# Patient Record
Sex: Female | Born: 1935 | Race: White | Hispanic: No | Marital: Married | Smoking: Never smoker
Health system: Southern US, Community
[De-identification: ages and names within clinical notes are randomized; demographics above are authoritative.]

## PROBLEM LIST (undated history)

## (undated) DIAGNOSIS — F419 Anxiety disorder, unspecified: Secondary | ICD-10-CM

## (undated) DIAGNOSIS — I714 Abdominal aortic aneurysm, without rupture, unspecified: Secondary | ICD-10-CM

## (undated) DIAGNOSIS — E7800 Pure hypercholesterolemia, unspecified: Secondary | ICD-10-CM

## (undated) DIAGNOSIS — I10 Essential (primary) hypertension: Secondary | ICD-10-CM

## (undated) DIAGNOSIS — K219 Gastro-esophageal reflux disease without esophagitis: Secondary | ICD-10-CM

## (undated) DIAGNOSIS — M810 Age-related osteoporosis without current pathological fracture: Secondary | ICD-10-CM

---

## 2003-11-11 ENCOUNTER — Ambulatory Visit: Payer: Self-pay | Admitting: Unknown Physician Specialty

## 2004-04-17 ENCOUNTER — Ambulatory Visit: Payer: Self-pay | Admitting: Unknown Physician Specialty

## 2004-12-07 ENCOUNTER — Ambulatory Visit: Payer: Self-pay | Admitting: Unknown Physician Specialty

## 2004-12-12 ENCOUNTER — Ambulatory Visit: Payer: Self-pay | Admitting: Unknown Physician Specialty

## 2005-12-17 ENCOUNTER — Ambulatory Visit: Payer: Self-pay | Admitting: Unknown Physician Specialty

## 2006-10-29 ENCOUNTER — Ambulatory Visit: Payer: Self-pay | Admitting: Unknown Physician Specialty

## 2006-12-24 ENCOUNTER — Ambulatory Visit: Payer: Self-pay | Admitting: Unknown Physician Specialty

## 2008-04-15 ENCOUNTER — Ambulatory Visit: Payer: Self-pay | Admitting: Unknown Physician Specialty

## 2009-04-19 ENCOUNTER — Ambulatory Visit: Payer: Self-pay | Admitting: Unknown Physician Specialty

## 2010-04-24 ENCOUNTER — Ambulatory Visit: Payer: Self-pay | Admitting: Unknown Physician Specialty

## 2010-04-25 ENCOUNTER — Ambulatory Visit: Payer: Self-pay | Admitting: Internal Medicine

## 2011-03-20 ENCOUNTER — Ambulatory Visit: Payer: Self-pay | Admitting: Ophthalmology

## 2011-03-27 ENCOUNTER — Ambulatory Visit: Payer: Self-pay | Admitting: Ophthalmology

## 2011-05-07 ENCOUNTER — Ambulatory Visit: Payer: Self-pay | Admitting: Internal Medicine

## 2011-05-21 ENCOUNTER — Ambulatory Visit: Payer: Self-pay | Admitting: Ophthalmology

## 2014-05-23 NOTE — Op Note (Signed)
PATIENT NAME:  Sheri Porter, Sheri Porter MR#:  [id number] DATE OF BIRTH:  1926/05/06  DATE OF PROCEDURE:  05/21/2011  PREOPERATIVE DIAGNOSIS:  Cataract, right eye.   POSTOPERATIVE DIAGNOSIS:  Cataract, right eye.  PROCEDURE PERFORMED:  Extracapsular cataract extraction using phacoemulsification with placement of an Alcon SN6CWS, 22.0-diopter posterior chamber lens, serial [id number].  SURGEON:  Maylon PeppersSteven A. Tationna Fullard, MD  ASSISTANT:  None.  ANESTHESIA:  4% lidocaine and 0.75% Marcaine in a 50/50 mixture with 10 units/mL of Hylenex added, given as a peribulbar.   ANESTHESIOLOGIST:  Dr. Darleene CleaverVan Staveren.   COMPLICATIONS:  None.  ESTIMATED BLOOD LOSS:  Less than 1 ml.  DESCRIPTION OF PROCEDURE:  The patient was brought to the operating room and given a peribulbar block.  The patient was then prepped and draped in the usual fashion.  The vertical rectus muscles were imbricated using 5-0 silk sutures.  These sutures were then clamped to the sterile drapes as bridle sutures.  A limbal peritomy was performed extending two clock hours and hemostasis was obtained with cautery.  A partial thickness scleral groove was made at the surgical limbus and dissected anteriorly in a lamellar dissection using an Alcon crescent knife.  The anterior chamber was entered superonasally with a Superblade and through the lamellar dissection with a 2.6 mm keratome.  DisCoVisc was used to replace the aqueous and a continuous tear capsulorrhexis was carried out.  Hydrodissection and hydrodelineation were carried out with balanced salt and a 27 gauge canula.  The nucleus was rotated to confirm the effectiveness of the hydrodissection.  Phacoemulsification was carried out using a divide-and-conquer technique.  Total ultrasound time was three minutes and ten seconds with an average power of 21.6 percent. CDE 61.56.   Irrigation/aspiration was used to remove the residual cortex.  DisCoVisc was used to inflate the capsule and the internal  incision was enlarged to 3 mm with the crescent knife.  The intraocular lens was folded and inserted into the capsular bag using the AcrySert delivery system. Irrigation/aspiration was used to remove the residual DisCoVisc.  Miostat was injected into the anterior chamber through the paracentesis track to inflate the anterior chamber and induce miosis.  The wound was checked for leaks and none were found. The conjunctiva was closed with cautery and the bridle sutures were removed.  Two drops of 0.3% Vigamox were placed on the eye.   An eye shield was placed on the eye.  The patient was discharged to the recovery room in good condition.   ____________________________ Maylon PeppersSteven A. Shiori Adcox, MD sad:ap D: 05/21/2011 13:07:11 ET T: 05/21/2011 13:28:23 ET JOB#: [id number]  cc: Viviann SpareSteven A. Blandina Renaldo, MD, <Dictator> Erline LevineSTEVEN A Illyria Sobocinski MD ELECTRONICALLY SIGNED 05/28/2011 13:45

## 2014-05-23 NOTE — Op Note (Signed)
PATIENT NAME:  Sheri Porter, Sheri Porter MR#:  [id number] DATE OF BIRTH:  [birth year]  DATE OF PROCEDURE:  03/27/2011  PREOPERATIVE DIAGNOSIS:  Cataract, left eye.    POSTOPERATIVE DIAGNOSIS:  Cataract, left eye.  PROCEDURE PERFORMED:  Extracapsular cataract extraction using phacoemulsification with placement of an Alcon SN6CWS, 23.0-diopter posterior chamber lens, serial # [id number].   SURGEON:  Maylon PeppersSteven A. Lunah Losasso, MD  ASSISTANT:  None.  ANESTHESIA:  4% lidocaine and 0.75% Marcaine in a 50/50 mixture with 10 units per mL of Hylenex added given as peribulbar.  ANESTHESIOLOGIST:  Dr. Pernell DupreAdams   COMPLICATIONS:  None.  ESTIMATED BLOOD LOSS:  Less than 1 mL.  DESCRIPTION OF PROCEDURE:  The patient was brought to the operating room and given a peribulbar block.  The patient was then prepped and draped in the usual fashion.  The vertical rectus muscles were imbricated using 5-0 silk sutures.  These sutures were then clamped to the sterile drapes as bridle sutures.  A limbal peritomy was performed extending two clock hours and hemostasis was obtained with cautery.  A partial thickness scleral groove was made at the surgical limbus and dissected anteriorly in a lamellar dissection using an Alcon crescent knife.  The anterior chamber was entered supero-temporally with a Superblade and through the lamellar dissection with a 2.6 mm keratome.  DisCoVisc was used to replace the aqueous and a continuous tear capsulorrhexis was carried out.  Hydrodissection and hydrodelineation were carried out with balanced salt and a 27 gauge canula.  The nucleus was rotated to confirm the effectiveness of the hydrodissection.  Phacoemulsification was carried out using a divide-and-conquer technique.  Total ultrasound time was 1 minute and 39.1 seconds with an average power of 24.7 percent, CDE 36.68.  Irrigation/aspiration was used to remove the residual cortex.  DisCoVisc was used to inflate the capsule and the internal incision  was enlarged to 3 mm with the crescent knife.  The intraocular lens was folded and inserted into the capsular bag using the AcrySert delivery system.  Irrigation/aspiration was used to remove the residual DisCoVisc.  Miostat was injected into the anterior chamber through the paracentesis track to inflate the anterior chamber and induce miosis.  The wound was checked for leaks and none were found. The conjunctiva was closed with cautery and the bridle sutures were removed.  Two drops of 0.3% Vigamox were placed on the eye.   An eye shield was placed on the eye.  The patient was discharged to the recovery room in good condition.  ____________________________ Maylon PeppersSteven A. Mozel Burdett, MD sad:drc D: 03/27/2011 15:09:19 ET T: 03/27/2011 15:39:06 ET JOB#: [id number]  cc: Viviann SpareSteven A. Raye Slyter, MD, <Dictator> Erline LevineSTEVEN A Shep Porter MD ELECTRONICALLY SIGNED 04/01/2011 15:59

## 2016-04-17 ENCOUNTER — Ambulatory Visit: Admission: RE | Admit: 2016-04-17 | Payer: Self-pay | Source: Ambulatory Visit

## 2016-04-17 ENCOUNTER — Other Ambulatory Visit: Payer: Self-pay | Admitting: Orthopedic Surgery

## 2016-04-17 DIAGNOSIS — M25551 Pain in right hip: Secondary | ICD-10-CM

## 2016-04-18 ENCOUNTER — Ambulatory Visit
Admission: RE | Admit: 2016-04-18 | Discharge: 2016-04-18 | Disposition: A | Discharge status: Home / Self Care | Payer: Medicare Other | Source: Ambulatory Visit | Attending: Orthopedic Surgery | Admitting: Orthopedic Surgery

## 2016-04-18 DIAGNOSIS — K5790 Diverticulosis of intestine, part unspecified, without perforation or abscess without bleeding: Secondary | ICD-10-CM | POA: Insufficient documentation

## 2016-04-18 DIAGNOSIS — M8448XA Pathological fracture, other site, initial encounter for fracture: Secondary | ICD-10-CM | POA: Insufficient documentation

## 2016-04-18 DIAGNOSIS — M25551 Pain in right hip: Secondary | ICD-10-CM | POA: Diagnosis present

## 2016-04-20 ENCOUNTER — Emergency Department
Admission: EM | Admit: 2016-04-20 | Discharge: 2016-04-20 | Disposition: A | Discharge status: Home / Self Care | Payer: Medicare Other | Attending: Emergency Medicine | Admitting: Emergency Medicine

## 2016-04-20 ENCOUNTER — Encounter: Payer: Self-pay | Admitting: Emergency Medicine

## 2016-04-20 ENCOUNTER — Encounter
Admission: RE | Admit: 2016-04-20 | Discharge: 2016-04-20 | Disposition: A | Discharge status: Home / Self Care | Payer: Medicare Other | Source: Ambulatory Visit | Attending: Internal Medicine | Admitting: Internal Medicine

## 2016-04-20 DIAGNOSIS — Y999 Unspecified external cause status: Secondary | ICD-10-CM | POA: Diagnosis not present

## 2016-04-20 DIAGNOSIS — Y929 Unspecified place or not applicable: Secondary | ICD-10-CM | POA: Diagnosis not present

## 2016-04-20 DIAGNOSIS — S329XXD Fracture of unspecified parts of lumbosacral spine and pelvis, subsequent encounter for fracture with routine healing: Secondary | ICD-10-CM | POA: Insufficient documentation

## 2016-04-20 DIAGNOSIS — S3993XD Unspecified injury of pelvis, subsequent encounter: Secondary | ICD-10-CM | POA: Diagnosis present

## 2016-04-20 DIAGNOSIS — X58XXXA Exposure to other specified factors, initial encounter: Secondary | ICD-10-CM | POA: Diagnosis not present

## 2016-04-20 DIAGNOSIS — Y939 Activity, unspecified: Secondary | ICD-10-CM | POA: Diagnosis not present

## 2016-04-20 DIAGNOSIS — I10 Essential (primary) hypertension: Secondary | ICD-10-CM | POA: Insufficient documentation

## 2016-04-20 HISTORY — DX: Anxiety disorder, unspecified: F41.9

## 2016-04-20 HISTORY — DX: Abdominal aortic aneurysm, without rupture, unspecified: I71.40

## 2016-04-20 HISTORY — DX: Age-related osteoporosis without current pathological fracture: M81.0

## 2016-04-20 HISTORY — DX: Pure hypercholesterolemia, unspecified: E78.00

## 2016-04-20 HISTORY — DX: Gastro-esophageal reflux disease without esophagitis: K21.9

## 2016-04-20 HISTORY — DX: Abdominal aortic aneurysm, without rupture: I71.4

## 2016-04-20 HISTORY — DX: Essential (primary) hypertension: I10

## 2016-04-20 NOTE — ED Notes (Signed)
Social Work at bedside 

## 2016-04-20 NOTE — ED Notes (Signed)
Patient home with family.

## 2016-04-20 NOTE — [place] FL2 (Signed)
  [place] MEDICAID FL2 LEVEL OF CARE SCREENING TOOL     IDENTIFICATION  Patient Name: Sheri Porter Birthdate: 07/16/1926 Sex: female Admission Date (Current Location): 04/20/2016  [place]   Facility and Address:  [facility], [address], [place], [place] [address]      Provider Number: [id number]  Attending Physician Name and Address:  Jeanmarie PlantJames A McShane, MD  Relative Name and Phone Number:       Current Level of Care: Hospital Recommended Level of Care: Skilled Nursing Facility Prior Approval Number:    Date Approved/Denied:   PASRR Number: [id number]  Discharge Plan: SNF    Current Diagnoses: There are no active problems to display for this patient.   Orientation RESPIRATION BLADDER Height & Weight     Self, Time, Situation, Place  Normal Continent Weight: 102 lb (46.3 kg) Height:  5' (152.4 cm)  BEHAVIORAL SYMPTOMS/MOOD NEUROLOGICAL BOWEL NUTRITION STATUS      Continent Diet (Normal)  AMBULATORY STATUS COMMUNICATION OF NEEDS Skin   Limited Assist Verbally Normal                       Personal Care Assistance Level of Assistance  Bathing, Feeding, Dressing, Total care Bathing Assistance: Independent Feeding assistance: Limited assistance Dressing Assistance: Limited assistance Total Care Assistance: Limited assistance   Functional Limitations Info  Sight, Hearing, Speech Sight Info: Adequate Hearing Info: Impaired (Hear aid left ear) Speech Info: Adequate    SPECIAL CARE FACTORS FREQUENCY                       Contractures Contractures Info: Not present    Additional Factors Info  Code Status, Allergies Code Status Info: Full code Allergies Info: Prepulsid, septra, Trazadone nefazodone           Current Medications (04/20/2016):  This is the current hospital active medication list No current facility-administered medications for this encounter.    No current outpatient  prescriptions on file.     Discharge Medications: Please see discharge summary for a list of discharge medications.  Relevant Imaging Results:  Relevant Lab Results:   Additional Information    Johnella MoloneyBandi, [place]rack[place]laud[place] M, [place]

## 2016-04-20 NOTE — ED Provider Notes (Addendum)
[facility] Emergency Department Provider Note  ____________________________________________   I have reviewed the triage vital signs and the nursing notes.   HISTORY  Chief Complaint Pelvic Fracture Pain    HPI Sheri Porter is a 83 y.o. female with a known inoperable pelvic fracture diagnosed on Wednesday. Patient has been at home with family. She is beginning 25 mg of tramadol twice a day. They feel this is insufficient pain control. When I ask if they would like the patient to have better coverage physician the room to go up on that, they state no they would like her to be admitted to the hospital and then admitted to a nursing home. This is the refill cause of the visit. Patient does not have a recent fall. She does live by herself at home.       Past Medical History:  Diagnosis Date  . Abdominal aortic aneurysm without rupture (HCC)   . Anxiety   . GERD (gastroesophageal reflux disease)   . Hypercholesteremia   . Hypertension   . Osteoporosis     There are no active problems to display for this patient.   History reviewed. No pertinent surgical history.  Prior to Admission medications   Not on File    Allergies Propulsid [cisapride]; Septra [sulfamethoxazole-trimethoprim]; and Trazodone and nefazodone  History reviewed. No pertinent family history.  Social History Social History  Substance Use Topics  . Smoking status: Never Smoker  . Smokeless tobacco: Never Used  . Alcohol use No    Review of Systems Constitutional: No fever/chills Eyes: No visual changes. ENT: No sore throat. No stiff neck no neck pain Cardiovascular: Denies chest pain. Respiratory: Denies shortness of breath. Gastrointestinal:   no vomiting.  No diarrhea.  No constipation. Genitourinary: Negative for dysuria. Musculoskeletal: Negative lower extremity swelling Skin: Negative for rash. Neurological: Negative for severe headaches, focal weakness or  numbness. 10-point ROS otherwise negative.  ____________________________________________   PHYSICAL EXAM:  VITAL SIGNS: ED Triage Vitals  Enc Vitals Group     BP 04/20/16 1058 127/88     Pulse Rate 04/20/16 1058 81     Resp 04/20/16 1058 18     Temp 04/20/16 1058 97.5 F (36.4 C)     Temp Source 04/20/16 1058 Oral     SpO2 04/20/16 1053 95 %     Weight 04/20/16 1059 102 lb (46.3 kg)     Height 04/20/16 1059 5' (1.524 m)     Head Circumference --      Peak Flow --      Pain Score --      Pain Loc --      Pain Edu? --      Excl. in GC? --     Constitutional: Alert and orientedTo name and place unsure of the date. Well appearing and in no acute distress. Eyes: Conjunctivae are normal. PERRL. EOMI. Head: Atraumatic. Nose: No congestion/rhinnorhea. Mouth/Throat: Mucous membranes are moist.  Oropharynx non-erythematous. Neck: No stridor.   Nontender with no meningismus Cardiovascular: Normal rate, regular rhythm. Grossly normal heart sounds.  Good peripheral circulation. Respiratory: Normal respiratory effort.  No retractions. Lungs CTAB. Abdominal: Soft and nontender. No distention. No guarding no rebound Back:  There is no focal tenderness or step off.  there is no midline tenderness there are no lesions noted. there is no CVA tenderness Musculoskeletal: No lower extremity tenderness, no upper extremity tenderness. No joint effusions, no DVT signs strong distal pulses no edema Neurologic:  Normal  speech and language. No gross focal neurologic deficits are appreciated.  Skin:  Skin is warm, dry and intact. No rash noted. Psychiatric: Mood and affect are normal. Speech and behavior are normal.  ____________________________________________   LABS (all labs ordered are listed, but only abnormal results are displayed)  Labs Reviewed - No data to display ____________________________________________  EKG  I personally interpreted any EKGs ordered by me or  triage  ____________________________________________  RADIOLOGY  I reviewed any imaging ordered by me or triage that were performed during my shift and, if possible, patient and/or family made aware of any abnormal findings. ____________________________________________   PROCEDURES  Procedure(s) performed: None  Procedures  Critical Care performed: None  ____________________________________________   INITIAL IMPRESSION / ASSESSMENT AND PLAN / ED COURSE  Pertinent labs & imaging results that were available during my care of the patient were reviewed by me and considered in my medical decision making (see chart for details).  Patient here essentially for pain control and placement for a pelvic fracture that happened several days ago. We will have case manager talked to the family about their options in this regard. She has no ongoing pain at this moment not requesting pain medication vital signs are stable  ----------------------------------------- 11:50 AM on 04/20/2016 -----------------------------------------  A work, case management of both discussed with this patient. Unfortunately by Medicare rules she does not qualify for Medicare funded skilled nursing facility placement. However, there is always the option for private pay if family can afford it. I also discussed with Dr. Dareen Piano, who tells me that he has been telling the family this all week and that there was limited utility in an emergency room visit because the Medicare rules will not change based upon a presentation to the emergency room. They have already according to Dr. Dareen Piano started to set up home health care and already filled out and FL2 . I have asked social work to see if they can be given all the legal options we have. Even if we do get the patient admitted, it would not likely be for anything more than an observational stay which would not meet Medicare requirements for acute placement. This it seems to me  should be therefore managed at home or with private headache given the limitations of the insurance that the patient has unfortunately. This is obviously not something that I are anyone here has any control over we have tried explaining this to the family. According to primary care this has been explained to them already.. I can certainly increase her pain medication dosage to see if we can maintain adequate analgesia and we will further discuss with family. This is a lamentablel situation in these respects.   ----------------------------------------- 1:14 PM on 04/20/2016 -----------------------------------------  Patient and family would like to go home at this time. They do not wish any further care from me. He did offer to physical therapy evaluate him but they do not wish do that here. I've advised to go up on her tramadol to a full pill twice a day, they will follow up close with primary care doctor. Patient is in no acute distress. Case management assures me she does not meet criteria for admission and I agree. I have explained extensively the limitations of what we can do for them unfortunately given their insurance situation and they will continue to pursue outpatient care at skilled nursing facilities possibly to her private pay. They will call her doctor today and continue to work on this. No further  issues in the family is eager to go home. They decline offered pain medication for this patient as she is para comfortable with no discomfort and they understand they must come back if they feel worse. Penni Bombard, nurse, was present for this conversation ----------------------------------------- 2:15 PM on 04/20/2016 -----------------------------------------  I filled out fl2 form and we have found placement for her on Sunday, today is Friday. Family happy with this plan.    ____________________________________________   FINAL CLINICAL IMPRESSION(S) / ED DIAGNOSES  Final diagnoses:  None       This chart was dictated using voice recognition software.  Despite best efforts to proofread,  errors can occur which can change meaning.      Jeanmarie Plant, MD 04/20/16 1126    Jeanmarie Plant, MD 04/20/16 1155    Jeanmarie Plant, MD 04/20/16 1316    Jeanmarie Plant, MD 04/20/16 1319    Jeanmarie Plant, MD 04/20/16 1416

## 2016-04-20 NOTE — Discharge Instructions (Signed)
Return to the emergency room for any new or worrisome symptoms. I encourage you to continue pursuing options for outpatient placement as you have discussed with us and follow closely with Dr. Dareen PianoAnderson. I would take a full tramadol instead of a half a tramadol for her I think this will help pain management, he can do that at the same scheduled dosing.

## 2016-04-20 NOTE — Progress Notes (Signed)
LCSW and Care manager went in to discuss the current situation with family to assess their immediate needs. Care Manager explained medicare benefits and limitations and stated that in home personal supports would be out of pocket with the exception of PT 3x week. LCSW explained that a facility could be found for the patient  but it would be a private pay. LCSW did explain at length that the information would be compiled and bed offers would be made and the family would be able to select. (Private pay at most facilities required 30 day up front fee) approx  300.00 per day. LCSW offered to be available once they decide how they would like to proceed.  Will await for Care managers plan and family requested EDP to consult again. Suezette Lafave LCSW

## 2016-04-20 NOTE — Clinical Social Work Note (Signed)
Clinical Social Work Assessment  Patient Details  Name: Sheri Porter MRN: [id number] Date of Birth: 04/17/1926  Date of referral:  04/13/16               Reason for consult:  Facility Placement                Permission sought to share information with:  Family Supports Permission granted to share information::  Yes, Verbal Permission Granted  Name::        Agency::     Relationship::  [name]- Daughter [phone]  Contact Information:     Housing/Transportation Living arrangements for the past 2 months:  Single Family Home Source of Information:  Patient, Adult Children Patient Interpreter Needed:  None Criminal Activity/Legal Involvement Pertinent to Current Situation/Hospitalization:  No - Comment as needed Significant Relationships:  Adult Children Lives with:  Self Do you feel safe going back to the place where you live?  No Need for family participation in patient care:  Yes (Comment)  Care giving concerns: Patient needs SNF as she recovers from hip fracture   Social Worker assessment / plan: LCSW and care Manager introduced ourselves to both family members. Verbal permission was granted to speak to Daughter and son in law and patients neighbour. We were given verbal consent to speak to all facilities and patients family agreeable for 30 day private pay. They will arrange out patient PT for their loved one. It was explained to family that all information collected would go out to facilities and  Bed offers would be presented to family. LCSW will complete Fl2 and Passr number.  Employment status:  Retired Health and safety inspectornsurance information:  Medicare PT Recommendations:  Not assessed at this time Information / Referral to community resources:  Skilled Nursing Facility  Patient/Family's Response to care:  They are unable to provide in home supports at this time  Patient/Family's Understanding of and Emotional Response to Diagnosis, Current Treatment, and Prognosis:  They  would like her in a facility ASAP  Emotional Assessment Appearance:  Appears stated age Attitude/Demeanor/Rapport:   (Polite and calm) Affect (typically observed):  Accepting, Adaptable, Appropriate, Calm Orientation:  Oriented to Self, Oriented to Place, Oriented to  Time, Oriented to Situation Alcohol / Substance use:  Not Applicable Psych involvement (Current and /or in the community):  No (Comment)  Discharge Needs  Concerns to be addressed:  Home Safety Concerns Readmission within the last 30 days:  No Current discharge risk:  None Barriers to Discharge:  No Barriers Identified   Cheron SchaumannBandi, [name], LCSW 04/20/2016, 1:34 PM

## 2016-04-20 NOTE — ED Notes (Addendum)
Patient's family is here to look for permanent placement for patient.  Family believes that in home care or home PT would be insufficient at this time.  MD is aware of family desires.  Case Manager to be informed of information so they can speak with family and work on placement.

## 2016-04-20 NOTE — Care Management Note (Signed)
Case Management Note  Patient Details  Name: Sheri Porter MRN: [id number] Date of Birth: 11/11/1926  Subjective/Objective:    Spoke to patient , family and neighbor in room. MD has designated that patient does not meet any admission criteria, either OBSv or IP. Claudine, CSW and I have presented to the family that their options are to pay out of pocket for personal services, or pay out of pocket for placement. They are a bit overwhelmed, but the son in law is visibly angered that he feels the MD is making a poor judgement. We have re-iterated that all hospitals follow the same Medicare quidelines, and the patient does not require placement that they will pay for. I have provided them with the personal services list that is approved, and Claudine will follow up with the family once they have spoken with the MD again.     If they want to pursue placement then Claudine will assist them in the process.           Action/Plan:   Expected Discharge Date:                  Expected Discharge Plan:     In-House Referral:     Discharge planning Services     Post Acute Care Choice:    Choice offered to:     DME Arranged:    DME Agency:     HH Arranged:    HH Agency:     Status of Service:     If discussed at [employer] Length of Stay Meetings, dates discussed:    Additional Comments:  Sheri BueCheryl Soren Pigman, RN 04/20/2016, 11:50 AM

## 2016-04-20 NOTE — ED Triage Notes (Signed)
Pt to ED by EMS with c/o of increased pain after being dx with a pelvic fracture Wed. Pt Rx Tramadol 50mg . Family has been giving 1/2 tablet (25mg ) with breakfast and Dinner. Per family pt had increased difficulty getting out of bed this morning. Pt denies pain at this time.

## 2016-04-20 NOTE — ED Notes (Signed)
ED Provider at bedside. 

## 2016-04-20 NOTE — Progress Notes (Signed)
LCSW complete Fl2 and obtained Passr Number. Marcelino DusterMichelle from [facility] called and is willing to review and possibly take her ( will know in 1 hour ) Elon JesterMichele from [facility] will get in touch with family in next hour.  Will consult with EDP and EDRN Avis Mcmahill LCSW [phone]

## 2016-04-20 NOTE — Progress Notes (Signed)
[facility] bed on Sunday AM to the family and they have accepted. Contact information has been forwarded and family and facility will make the necessary arrangements.  Spoke to patient and EDP and EDRN and she is now discharged and family will provide 34-7 care for next 2 days until she goes to [facility] on Sunday. LCSW consulted with Care Manager and EDP.  All parties are in agreement and patient is to be discharged into the care of her daughter and son in law.  Delta Air LinesClaudine Zareah Hunzeker LCSW [phone]

## 2016-04-20 NOTE — [place] FL2 (Deleted)
  [place] MEDICAID FL2 LEVEL OF CARE SCREENING TOOL     IDENTIFICATION  Patient Name: Sheri Porter Birthdate: 08/15/1926 Sex: female Admission Date (Current Location): 04/20/2016  [place]   Facility and Address:  [facility], [address], [place], [place] [address]      Provider Number: [id number]  Attending Physician Name and Address:  Jeanmarie PlantJames A McShane, MD  Relative Name and Phone Number:       Current Level of Care: Hospital Recommended Level of Care: Skilled Nursing Facility Prior Approval Number:    Date Approved/Denied:   PASRR Number:    Discharge Plan: SNF    Current Diagnoses: There are no active problems to display for this patient.   Orientation RESPIRATION BLADDER Height & Weight     Self, Time, Situation, Place  Normal Continent Weight: 102 lb (46.3 kg) Height:  5' (152.4 cm)  BEHAVIORAL SYMPTOMS/MOOD NEUROLOGICAL BOWEL NUTRITION STATUS      Continent Diet (Normal)  AMBULATORY STATUS COMMUNICATION OF NEEDS Skin   Limited Assist Verbally Normal                       Personal Care Assistance Level of Assistance  Bathing, Feeding, Dressing, Total care Bathing Assistance: Independent Feeding assistance: Limited assistance Dressing Assistance: Limited assistance Total Care Assistance: Limited assistance   Functional Limitations Info  Sight, Hearing, Speech Sight Info: Adequate Hearing Info: Impaired (Hear aid left ear) Speech Info: Adequate    SPECIAL CARE FACTORS FREQUENCY                       Contractures Contractures Info: Not present    Additional Factors Info  Code Status, Allergies Code Status Info: Full code Allergies Info: Prepulsid, septra, Trazadone nefazodone           Current Medications (04/20/2016):  This is the current hospital active medication list No current facility-administered medications for this encounter.    No current outpatient prescriptions  on file.     Discharge Medications: Please see discharge summary for a list of discharge medications.  Relevant Imaging Results:  Relevant Lab Results:   Additional Information   SSN [id number]  Arrie SenateBandi, Hakan Nudelman [place], [place]

## 2016-04-25 ENCOUNTER — Non-Acute Institutional Stay (SKILLED_NURSING_FACILITY): Payer: Medicare Other | Admitting: Gerontology

## 2016-04-25 DIAGNOSIS — G8918 Other acute postprocedural pain: Secondary | ICD-10-CM

## 2016-04-25 DIAGNOSIS — M80051D Age-related osteoporosis with current pathological fracture, right femur, subsequent encounter for fracture with routine healing: Secondary | ICD-10-CM | POA: Diagnosis not present

## 2016-04-28 DIAGNOSIS — M80051D Age-related osteoporosis with current pathological fracture, right femur, subsequent encounter for fracture with routine healing: Secondary | ICD-10-CM | POA: Insufficient documentation

## 2016-04-28 NOTE — Progress Notes (Signed)
Location:      Place of Service:  SNF (31) Provider:  Lorenso Quarry, NP-C  Lauro Regulus., MD  Patient Care Team: Lauro Regulus, MD as PCP - General (Internal Medicine)  Extended Emergency Contact Information Primary Emergency Contact: Broach,Janet P Address: [address]          [place], [place] [address] [place] of [place] Home Phone: [phone] Relation: Daughter  Code Status:  full Goals of care: Advanced Directive information Advanced Directives 04/20/2016  Does Patient Have a Medical Advance Directive? Yes  Type of Advance Directive Healthcare Power of Attorney     Chief Complaint  Patient presents with  . Follow-up    HPI:  Pt is a 83 y.o. female seen today for follow-up following admission to the facility for rehab after admission to [facility] for right femur fracture without surgical intervention. Pt has been participating in PT/OT. She reports her pain is well controlled. It will get to be an 8/10 but decreases to 3/10 with medications. She is tolerating therapy well. Steady gait. Has DOE at times, but reports this is her baseline. Appetite is good. Overall, doing well. Pt denies n/v/d/f/c/cp/sob/ha/abd pain/dizziness/ha. No other complaints. VSS.      Past Medical History:  Diagnosis Date  . Abdominal aortic aneurysm without rupture (HCC)   . Anxiety   . GERD (gastroesophageal reflux disease)   . Hypercholesteremia   . Hypertension   . Osteoporosis    No past surgical history on file.  Allergies  Allergen Reactions  . Propulsid [Cisapride]   . Septra [Sulfamethoxazole-Trimethoprim]   . Trazodone And Nefazodone     Allergies as of 04/25/2016      Reactions   Propulsid [cisapride]    Septra [sulfamethoxazole-trimethoprim]    Trazodone And Nefazodone       Medication List    as of 04/25/2016 11:59 PM   You have not been prescribed any medications.     Review of Systems  Constitutional: Negative for activity change, appetite  change, chills, diaphoresis and fever.  HENT: Negative for congestion, sneezing, sore throat, trouble swallowing and voice change.   Eyes: Negative for pain, redness and visual disturbance.  Respiratory: Negative for apnea, cough, choking, chest tightness, shortness of breath and wheezing.   Cardiovascular: Negative for chest pain, palpitations and leg swelling.  Gastrointestinal: Negative for abdominal distention, abdominal pain, constipation, diarrhea and nausea.  Genitourinary: Negative for difficulty urinating, dysuria, frequency and urgency.  Musculoskeletal: Positive for arthralgias (typical arthritis). Negative for back pain, gait problem and myalgias.  Skin: Negative for color change, pallor, rash and wound.  Neurological: Negative for dizziness, tremors, syncope, speech difficulty, weakness, numbness and headaches.  Psychiatric/Behavioral: Negative for agitation and behavioral problems.  All other systems reviewed and are negative.    There is no immunization history on file for this patient. There are no preventive care reminders to display for this patient. No flowsheet data found. Functional Status Survey:    Vitals:   04/25/16 1445  BP: (!) 147/69  Pulse: 80  Resp: 16  Temp: 97.6 F (36.4 C)  SpO2: 100%   There is no height or weight on file to calculate BMI. Physical Exam  Constitutional: She is oriented to person, place, and time. Vital signs are normal. She appears well-developed and well-nourished. She is active and cooperative. She does not appear ill. No distress.  HENT:  Head: Normocephalic and atraumatic.  Mouth/Throat: Uvula is midline, oropharynx is clear and moist and mucous membranes are normal.  Mucous membranes are not pale, not dry and not cyanotic.  Eyes: Conjunctivae, EOM and lids are normal. Pupils are equal, round, and reactive to light.  Neck: Trachea normal, normal range of motion and full passive range of motion without pain. Neck supple. No JVD  present. No tracheal deviation, no edema and no erythema present. No thyromegaly present.  Cardiovascular: Normal rate, regular rhythm, normal heart sounds, intact distal pulses and normal pulses.  Exam reveals no gallop, no distant heart sounds and no friction rub.   No murmur heard. Pulmonary/Chest: Effort normal and breath sounds normal. No accessory muscle usage. No respiratory distress. She has no wheezes. She has no rales. She exhibits no tenderness.  Abdominal: Normal appearance and bowel sounds are normal. She exhibits no distension and no ascites. There is no tenderness.  Musculoskeletal: Normal range of motion. She exhibits no edema or tenderness.  Expected osteoarthritis, stiffness  Neurological: She is alert and oriented to person, place, and time. She has normal strength.  Skin: Skin is warm, dry and intact. No rash noted. She is not diaphoretic. No cyanosis or erythema. No pallor. Nails show no clubbing.  Psychiatric: She has a normal mood and affect. Her speech is normal and behavior is normal. Judgment and thought content normal. Cognition and memory are normal.  Nursing note and vitals reviewed.   Labs reviewed: No results for input(s): NA, K, CL, CO2, GLUCOSE, BUN, CREATININE, CALCIUM, MG, PHOS in the last 8760 hours. No results for input(s): AST, ALT, ALKPHOS, BILITOT, PROT, ALBUMIN in the last 8760 hours. No results for input(s): WBC, NEUTROABS, HGB, HCT, MCV, PLT in the last 8760 hours. No results found for: TSH No results found for: HGBA1C No results found for: CHOL, HDL, LDLCALC, LDLDIRECT, TRIG, CHOLHDL  Significant Diagnostic Results in last 30 days:  Ct Pelvis Wo Contrast  Result Date: 04/18/2016 CLINICAL DATA:  Right hip and buttock pain for 1 month, no known injury, initial encounter EXAM: CT PELVIS WITHOUT CONTRAST TECHNIQUE: Multidetector CT imaging of the pelvis was performed following the standard protocol without intravenous contrast. COMPARISON:  None.  FINDINGS: Urinary Tract: Bladder is well distended. No filling defect is identified. Bowel: Scattered diverticular change of the colon is noted without evidence of diverticulitis. Vascular/Lymphatic: Diffuse vascular calcifications are seen without aneurysmal dilatation. Reproductive: The uterus is not well visualized and may have been surgically removed. Clinical correlation is recommended. Other:  No free fluid or acute soft tissue abnormality is seen. Musculoskeletal: Degenerative changes of the lumbar spine are noted. There are mildly displaced fractures involving the superior pubic ramus as well as the inferior pubic ramus at its junction with the pubic symphysis. No significant callus formation is seen. Degenerative changes of the pubic symphysis are noted. The proximal femur shows no acute abnormality. Mild irregularity in the right sacral ala is seen consistent with sacral insufficiency fractures. No definitive fracture is noted on the left. IMPRESSION: Changes of right pubic rami fractures as well as a right sacral insufficiency fracture. No femoral fracture is seen. Diverticulosis without diverticulitis. These results will be called to the ordering clinician or representative by the Radiologist Assistant, and communication documented in the PACS or zVision Dashboard. Electronically Signed   By: Alcide Clever M.D.   On: 04/18/2016 09:37    Assessment/Plan 1. Age-related osteoporosis with current pathological fracture of right femur with routine healing  Continue PT/OT  Aspirin 325 mg po BID for anticoagulation  Encourage PO fluid intake  Encourage nutritional intake   2.  Pain, acute postoperative  Tramadol 50 mg po BID scheduled for pain  Tramadol 50 mg po Q 6 hours PRN pain Continue complementary therapies including: Restorative Nursing Ice pack to site QID and prn Diversional activities Repositioning Q2 hours and prn Scheduled Tylenol 1000 mg po TID   Family/ staff Communication:    Total Time:  Documentation:  Face to Face:  Family/Phone:   Labs/tests ordered:    Medication list reviewed and assessed for continued appropriateness. Monthly medication orders reviewed and signed.  Brynda Rim, NP-C Geriatrics [facility] [phone] [address]. [address], [place] [address] Cell Phone (Mon-Fri 8am-5pm):  [phone] On Call:  [phone] & follow prompts after 5pm & weekends Office Phone:  [phone] Office Fax:  [phone]

## 2016-04-29 ENCOUNTER — Encounter
Admission: RE | Admit: 2016-04-29 | Discharge: 2016-04-29 | Disposition: A | Discharge status: Home / Self Care | Payer: Medicare Other | Source: Ambulatory Visit | Attending: Internal Medicine | Admitting: Internal Medicine

## 2016-05-02 ENCOUNTER — Inpatient Hospital Stay: Payer: Medicare Other

## 2016-05-02 ENCOUNTER — Inpatient Hospital Stay
Admission: EM | Admit: 2016-05-02 | Discharge: 2016-05-04 | DRG: 378 | Disposition: A | Discharge status: Home Health | Payer: Medicare Other | Attending: Internal Medicine | Admitting: Internal Medicine

## 2016-05-02 ENCOUNTER — Emergency Department: Payer: Medicare Other

## 2016-05-02 ENCOUNTER — Encounter: Payer: Self-pay | Admitting: Emergency Medicine

## 2016-05-02 DIAGNOSIS — S329XXA Fracture of unspecified parts of lumbosacral spine and pelvis, initial encounter for closed fracture: Secondary | ICD-10-CM | POA: Diagnosis present

## 2016-05-02 DIAGNOSIS — H9190 Unspecified hearing loss, unspecified ear: Secondary | ICD-10-CM | POA: Diagnosis present

## 2016-05-02 DIAGNOSIS — M810 Age-related osteoporosis without current pathological fracture: Secondary | ICD-10-CM | POA: Diagnosis present

## 2016-05-02 DIAGNOSIS — Z7189 Other specified counseling: Secondary | ICD-10-CM

## 2016-05-02 DIAGNOSIS — K219 Gastro-esophageal reflux disease without esophagitis: Secondary | ICD-10-CM | POA: Diagnosis present

## 2016-05-02 DIAGNOSIS — Z8249 Family history of ischemic heart disease and other diseases of the circulatory system: Secondary | ICD-10-CM | POA: Diagnosis not present

## 2016-05-02 DIAGNOSIS — Z66 Do not resuscitate: Secondary | ICD-10-CM | POA: Diagnosis present

## 2016-05-02 DIAGNOSIS — K922 Gastrointestinal hemorrhage, unspecified: Secondary | ICD-10-CM | POA: Diagnosis present

## 2016-05-02 DIAGNOSIS — I959 Hypotension, unspecified: Secondary | ICD-10-CM | POA: Diagnosis present

## 2016-05-02 DIAGNOSIS — K625 Hemorrhage of anus and rectum: Secondary | ICD-10-CM | POA: Diagnosis not present

## 2016-05-02 DIAGNOSIS — Z888 Allergy status to other drugs, medicaments and biological substances status: Secondary | ICD-10-CM

## 2016-05-02 DIAGNOSIS — I129 Hypertensive chronic kidney disease with stage 1 through stage 4 chronic kidney disease, or unspecified chronic kidney disease: Secondary | ICD-10-CM | POA: Diagnosis present

## 2016-05-02 DIAGNOSIS — Z881 Allergy status to other antibiotic agents status: Secondary | ICD-10-CM | POA: Diagnosis not present

## 2016-05-02 DIAGNOSIS — N183 Chronic kidney disease, stage 3 (moderate): Secondary | ICD-10-CM | POA: Diagnosis present

## 2016-05-02 DIAGNOSIS — Z515 Encounter for palliative care: Secondary | ICD-10-CM | POA: Diagnosis not present

## 2016-05-02 DIAGNOSIS — I714 Abdominal aortic aneurysm, without rupture: Secondary | ICD-10-CM | POA: Diagnosis present

## 2016-05-02 LAB — CBC WITH DIFFERENTIAL/PLATELET
Basophils Absolute: 0 10*3/uL (ref 0–0.1)
Basophils Relative: 1 %
Eosinophils Absolute: 0.1 10*3/uL (ref 0–0.7)
Eosinophils Relative: 1 %
HEMATOCRIT: 35.2 % (ref 35.0–47.0)
HEMOGLOBIN: 11.8 g/dL — AB (ref 12.0–16.0)
LYMPHS ABS: 1.9 10*3/uL (ref 1.0–3.6)
Lymphocytes Relative: 32 %
MCH: 30.1 pg (ref 26.0–34.0)
MCHC: 33.6 g/dL (ref 32.0–36.0)
MCV: 89.7 fL (ref 80.0–100.0)
MONOS PCT: 8 %
Monocytes Absolute: 0.5 10*3/uL (ref 0.2–0.9)
Neutro Abs: 3.6 10*3/uL (ref 1.4–6.5)
Neutrophils Relative %: 58 %
Platelets: 343 10*3/uL (ref 150–440)
RBC: 3.93 MIL/uL (ref 3.80–5.20)
RDW: 13.3 % (ref 11.5–14.5)
WBC: 6.2 10*3/uL (ref 3.6–11.0)

## 2016-05-02 LAB — COMPREHENSIVE METABOLIC PANEL
ALBUMIN: 3 g/dL — AB (ref 3.5–5.0)
ALT: 15 U/L (ref 14–54)
AST: 31 U/L (ref 15–41)
Alkaline Phosphatase: 229 U/L — ABNORMAL HIGH (ref 38–126)
Anion gap: 9 (ref 5–15)
BUN: 22 mg/dL — ABNORMAL HIGH (ref 6–20)
CHLORIDE: 109 mmol/L (ref 101–111)
CO2: 22 mmol/L (ref 22–32)
Calcium: 8.2 mg/dL — ABNORMAL LOW (ref 8.9–10.3)
Creatinine, Ser: 0.9 mg/dL (ref 0.44–1.00)
GFR calc Af Amer: >60 mL/min (ref >60)
GFR calc non Af Amer: 56 mL/min — ABNORMAL LOW (ref >60)
Glucose, Bld: 116 mg/dL — ABNORMAL HIGH (ref 65–99)
POTASSIUM: 3.7 mmol/L (ref 3.5–5.1)
SODIUM: 140 mmol/L (ref 135–145)
TOTAL PROTEIN: 6.4 g/dL — AB (ref 6.5–8.1)
Total Bilirubin: 0.5 mg/dL (ref 0.3–1.2)

## 2016-05-02 LAB — TROPONIN I: Troponin I: <0.03 ng/mL (ref <0.03)

## 2016-05-02 LAB — TYPE AND SCREEN
ABO/RH(D): O POS
ANTIBODY SCREEN: NEGATIVE

## 2016-05-02 LAB — HEMOGLOBIN AND HEMATOCRIT, BLOOD
HEMATOCRIT: 34.1 % — AB (ref 35.0–47.0)
HEMATOCRIT: 28.7 % — AB (ref 35.0–47.0)
HEMOGLOBIN: 11.4 g/dL — AB (ref 12.0–16.0)
HEMOGLOBIN: 9.7 g/dL — AB (ref 12.0–16.0)

## 2016-05-02 LAB — MRSA PCR SCREENING: MRSA BY PCR: NEGATIVE

## 2016-05-02 MED ORDER — IPRATROPIUM-ALBUTEROL 0.5-2.5 (3) MG/3ML IN SOLN
RESPIRATORY_TRACT | Status: AC
  Filled 2016-05-02: qty 9

## 2016-05-02 MED ORDER — VITAMIN D 1000 UNITS PO TABS
4000.0000 [IU] | ORAL_TABLET | Freq: Every day | ORAL | Status: DC
  Administered 2016-05-03 – 2016-05-04 (×2): 4000 [IU] via ORAL
  Filled 2016-05-02 (×3): qty 4

## 2016-05-02 MED ORDER — FLUTICASONE PROPIONATE 50 MCG/ACT NA SUSP
2.0000 | Freq: Every day | NASAL | Status: DC
  Administered 2016-05-02 – 2016-05-04 (×3): 2 via NASAL
  Filled 2016-05-02: qty 16

## 2016-05-02 MED ORDER — ONDANSETRON HCL 4 MG PO TABS: 4.0000 mg | ORAL_TABLET | Freq: Four times a day (QID) | ORAL | Status: DC | PRN

## 2016-05-02 MED ORDER — ACETAMINOPHEN 500 MG PO TABS
1000.0000 mg | ORAL_TABLET | Freq: Three times a day (TID) | ORAL | Status: DC
  Administered 2016-05-03 – 2016-05-04 (×3): 1000 mg via ORAL
  Filled 2016-05-02 (×4): qty 2

## 2016-05-02 MED ORDER — ACETAMINOPHEN 325 MG PO TABS: 650.0000 mg | ORAL_TABLET | Freq: Four times a day (QID) | ORAL | Status: DC | PRN

## 2016-05-02 MED ORDER — PANTOPRAZOLE SODIUM 40 MG IV SOLR
40.0000 mg | Freq: Two times a day (BID) | INTRAVENOUS | Status: DC
Start: 2016-05-02 — End: 2016-05-03
  Administered 2016-05-02: 40 mg via INTRAVENOUS
  Filled 2016-05-02 (×2): qty 40

## 2016-05-02 MED ORDER — ACETAMINOPHEN 650 MG RE SUPP: 650.0000 mg | Freq: Four times a day (QID) | RECTAL | Status: DC | PRN

## 2016-05-02 MED ORDER — METHYLPREDNISOLONE SODIUM SUCC 125 MG IJ SOLR
INTRAMUSCULAR | Status: AC
  Filled 2016-05-02: qty 2

## 2016-05-02 MED ORDER — SENNOSIDES-DOCUSATE SODIUM 8.6-50 MG PO TABS
1.0000 | ORAL_TABLET | Freq: Two times a day (BID) | ORAL | Status: DC
  Administered 2016-05-02 – 2016-05-04 (×4): 1 via ORAL
  Filled 2016-05-02 (×4): qty 1

## 2016-05-02 MED ORDER — PNEUMOCOCCAL VAC POLYVALENT 25 MCG/0.5ML IJ INJ: 0.5000 mL | INJECTION | INTRAMUSCULAR | Status: DC

## 2016-05-02 MED ORDER — SODIUM CHLORIDE 0.9 % IV BOLUS (SEPSIS)
1000.0000 mL | Freq: Once | INTRAVENOUS | Status: AC
  Administered 2016-05-02: 1000 mL via INTRAVENOUS

## 2016-05-02 MED ORDER — TECHNETIUM TC 99M-LABELED RED BLOOD CELLS IV KIT
25.0000 | PACK | Freq: Once | INTRAVENOUS | Status: AC | PRN
  Administered 2016-05-02: 24.619 via INTRAVENOUS

## 2016-05-02 MED ORDER — KETOROLAC TROMETHAMINE 15 MG/ML IJ SOLN
15.0000 mg | Freq: Four times a day (QID) | INTRAMUSCULAR | Status: DC | PRN
  Filled 2016-05-02: qty 1

## 2016-05-02 MED ORDER — SENNOSIDES-DOCUSATE SODIUM 8.6-50 MG PO TABS
1.0000 | ORAL_TABLET | Freq: Every evening | ORAL | Status: DC | PRN
  Administered 2016-05-02: 1 via ORAL

## 2016-05-02 MED ORDER — KETOROLAC TROMETHAMINE 30 MG/ML IJ SOLN
INTRAMUSCULAR | Status: AC
  Administered 2016-05-02: 15 mg
  Filled 2016-05-02: qty 1

## 2016-05-02 MED ORDER — ONDANSETRON HCL 4 MG/2ML IJ SOLN
INTRAMUSCULAR | Status: AC
  Administered 2016-05-02: 4 mg via INTRAVENOUS
  Filled 2016-05-02: qty 2

## 2016-05-02 MED ORDER — ONDANSETRON HCL 4 MG/2ML IJ SOLN
4.0000 mg | Freq: Four times a day (QID) | INTRAMUSCULAR | Status: DC | PRN
  Administered 2016-05-02: 4 mg via INTRAVENOUS

## 2016-05-02 MED ORDER — SODIUM CHLORIDE 0.9 % IV SOLN
INTRAVENOUS | Status: DC
  Administered 2016-05-02: 16:00:00 via INTRAVENOUS

## 2016-05-02 MED ORDER — RALOXIFENE HCL 60 MG PO TABS
60.0000 mg | ORAL_TABLET | Freq: Every day | ORAL | Status: DC
  Administered 2016-05-03 – 2016-05-04 (×2): 60 mg via ORAL
  Filled 2016-05-02 (×3): qty 1

## 2016-05-02 MED ORDER — BISACODYL 5 MG PO TBEC: 5.0000 mg | DELAYED_RELEASE_TABLET | Freq: Every day | ORAL | Status: DC | PRN

## 2016-05-02 NOTE — H&P (Signed)
[facility] [place] [facility]   PATIENT NAME: Sheri Porter    MR#:  [id number]  DATE OF BIRTH:  Jun 01, 1926  DATE OF ADMISSION:  05/02/2016  PRIMARY CARE PHYSICIAN: Lauro Regulus., MD   REQUESTING/REFERRING PHYSICIAN: Dr. Shaune Pollack  CHIEF COMPLAINT:   Bright red blood per rectum HISTORY OF PRESENT ILLNESS:  Sheri Porter  is a 83 y.o. female with a known history of  AAA and essential hypertension who is currently at [facility] for a pelvic fracture who presents with above complaint. Over the past day patient has had rectal bleeding with abdominal pain. She was found to have hypotension on arrival. She has been resuscitated with IV fluids and blood pressure has improved.  Patient has not had a GIB in he past. She has had a colonoscopy but it has been over 10 years.  PAST MEDICAL HISTORY:   Past Medical History:  Diagnosis Date  . Abdominal aortic aneurysm without rupture (HCC)  Follow by Dr. Wyn Quaker    . Anxiety   . GERD (gastroesophageal reflux disease)   . Hypercholesteremia   . Hypertension   . Osteoporosis     PAST SURGICAL HISTORY:  TAH and BSO   SOCIAL HISTORY:   Social History  Substance Use Topics  . Smoking status: Never Smoker  . Smokeless tobacco: Never Used  . Alcohol use No    FAMILY HISTORY:  CAD-brother and father DM mother  DRUG ALLERGIES:   Allergies  Allergen Reactions  . Propulsid [Cisapride]   . Septra [Sulfamethoxazole-Trimethoprim]   . Trazodone And Nefazodone     REVIEW OF SYSTEMS:   Review of Systems  Constitutional: Negative.  Negative for chills, fever and malaise/fatigue.  HENT: Negative.  Negative for ear discharge, ear pain, hearing loss, nosebleeds and sore throat.   Eyes: Negative.  Negative for blurred vision and pain.  Respiratory: Negative.  Negative for cough, hemoptysis, shortness of breath and wheezing.   Cardiovascular: Negative.  Negative for chest pain, palpitations and leg swelling.  Gastrointestinal:  Positive for abdominal pain and blood in stool. Negative for diarrhea, nausea and vomiting.  Genitourinary: Negative.  Negative for dysuria.  Musculoskeletal: Negative.  Negative for back pain.  Skin: Negative.   Neurological: Negative for dizziness, tremors, speech change, focal weakness, seizures and headaches.  Endo/Heme/Allergies: Negative.  Does not bruise/bleed easily.  Psychiatric/Behavioral: Negative.  Negative for depression, hallucinations and suicidal ideas.    MEDICATIONS AT HOME:   Prior to Admission medications   Medication Sig Start Date End Date Taking? Authorizing Provider  acetaminophen (TYLENOL) 500 MG tablet Take 1,000 mg by mouth 3 (three) times daily.   Yes Historical Provider, MD  aspirin 325 MG EC tablet Take 325 mg by mouth 2 (two) times daily.   Yes Historical Provider, MD  Cholecalciferol (VITAMIN D3) 1000 units CAPS Take 4,000 Units by mouth daily.   Yes Historical Provider, MD  fluticasone (FLONASE) 50 MCG/ACT nasal spray Place 2 sprays into both nostrils daily. 04/12/16  Yes Historical Provider, MD  raloxifene (EVISTA) 60 MG tablet Take 60 mg by mouth daily. 02/27/16  Yes Historical Provider, MD  sennosides-docusate sodium (SENOKOT-S) 8.6-50 MG tablet Take 1 tablet by mouth 2 (two) times daily.   Yes Historical Provider, MD  traMADol (ULTRAM) 50 MG tablet Take 25 mg by mouth 2 (two) times daily. Take 25 mg at bedtime (give 25 mg twice daily for pain).   Yes Historical Provider, MD      VITAL SIGNS:  Blood pressure Marland Kitchen)  156/56, pulse 81, temperature 97.5 F (36.4 C), temperature source Oral, resp. rate 17, height  (1.575 m), weight 45.4 kg (100 lb), SpO2 98 %.  PHYSICAL EXAMINATION:   Physical Exam  Constitutional: She is oriented to person, place, and time and well-developed, well-nourished, and in no distress. No distress.  HENT:  Head: Normocephalic.  Eyes: No scleral icterus.  Neck: Normal range of motion. Neck supple. No JVD present. No tracheal  deviation present.  Cardiovascular: Normal rate, regular rhythm and normal heart sounds.  Exam reveals no gallop and no friction rub.   No murmur heard. Pulmonary/Chest: Effort normal and breath sounds normal. No respiratory distress. She has no wheezes. She has no rales. She exhibits no tenderness.  Abdominal: Soft. Bowel sounds are normal. She exhibits no distension and no mass. There is no tenderness. There is no rebound and no guarding.  Musculoskeletal: Normal range of motion. She exhibits no edema.  Neurological: She is alert and oriented to person, place, and time.  Skin: Skin is warm. No rash noted. No erythema.  Psychiatric: Affect and judgment normal.      LABORATORY PANEL:   CBC  Recent Labs Lab 05/02/16 1010  WBC 6.2  HGB 11.8*  HCT 35.2  PLT 343   ------------------------------------------------------------------------------------------------------------------  Chemistries  No results for input(s): NA, K, CL, CO2, GLUCOSE, BUN, CREATININE, CALCIUM, MG, AST, ALT, ALKPHOS, BILITOT in the last 168 hours.  Invalid input(s): GFRCGP ------------------------------------------------------------------------------------------------------------------  Cardiac Enzymes No results for input(s): TROPONINI in the last 168 hours. ------------------------------------------------------------------------------------------------------------------  RADIOLOGY:  No results found.  EKG:   Sinus rhythm no ST elevation or depression  MPRESSION AND PLAN:   83 year old female presents with bright red blood per rectum.   1. Bright red blood per rectum: Patient will need GI consultation. Case d/w Dr Tobi Bastos  With recent health with fracture may be difficult to do colonoscopy  Hemoglobin every 6 hours Transfuse if vitals become unstable with hypotension and tachycardia or hemoglobin less than 7 Hold aspirin GIB scan ordered  2. Recent pelvic fracture: Continue supportive  care XRAY shows constipation Management as per GI given GIB  3. Benign hypertension with chronic kidney disease stage III: Patient is not on blood pressure medications  4. AAA: Patient follows with Dr. Wyn Quaker  5. Osteoporosis: Continue Evista   All the records are reviewed and case discussed with ED provider. Management plans discussed with the patient and family and they are in agreement  CODE STATUS: FULL  TOTAL TIME TAKING CARE OF THIS PATIENT: 45  minutes.    Darcella Shiffman M.D on 05/02/2016 at 11:04 AM  Between 7am to 6pm - Pager - [phone]  After 6pm go to [email or web address] - password [email or web address]  [facility] [place] [facility]  Office  [phone]  CC: Primary care physician; Lauro Regulus., MD

## 2016-05-02 NOTE — ED Notes (Signed)
Patient transported to radiology

## 2016-05-02 NOTE — ED Notes (Signed)
Family requesting to have their number left.  Daughter: Jenene Slicker [phone]

## 2016-05-02 NOTE — Progress Notes (Signed)
Family Meeting Note  Advance Directive:yes  Today a meeting took place with the Patient. And daughter  The following clinical team members were present during this meeting:MD  The following were discussed:Patient's diagnosis: BRBPR  , Patient's progosis: Unable to determine and Goals for treatment: FULL  Additional follow-up to be provided: patient needs living will Her daughter is POA Sheri Porter  Time spent during discussion:16 minutes  [name], Patricia Pesa, MD

## 2016-05-02 NOTE — Consult Note (Signed)
Wyline Mood MD  [address] [place], [place] [address] Phone: [phone] Fax : [phone]  Consultation  Referring Provider:  Dr Juliene Pina   Primary Care Physician:  Lauro Regulus., MD Primary Gastroenterologist:  None        Reason for Consultation:     GI bleed  Date of Admission:  05/02/2016 Date of Consultation:  05/02/2016         HPI:   Sheri Porter is a 83 y.o. female with a recent history of pelvic fracture came into the hospital with one day history of rectal bleeding . She was hypotensive when she presented to the hospital.On admission Hb was 11.8 grams and has remained stable.   Her family were in the room and said that she was transferred from the care home for "profuse rectal bleeding ", still having a small bowel movement which is dark red but not bright. No blood thinners except asprin. No NSAID use.   Last colonoscopy was 10 years back which she recalls was normal.    Past Medical History:  Diagnosis Date  . Abdominal aortic aneurysm without rupture (HCC)   . Anxiety   . GERD (gastroesophageal reflux disease)   . Hypercholesteremia   . Hypertension   . Osteoporosis     History reviewed. No pertinent surgical history.  Prior to Admission medications   Medication Sig Start Date End Date Taking? Authorizing Provider  acetaminophen (TYLENOL) 500 MG tablet Take 1,000 mg by mouth 3 (three) times daily.   Yes Historical Provider, MD  aspirin 325 MG EC tablet Take 325 mg by mouth 2 (two) times daily.   Yes Historical Provider, MD  Cholecalciferol (VITAMIN D3) 1000 units CAPS Take 4,000 Units by mouth daily.   Yes Historical Provider, MD  fluticasone (FLONASE) 50 MCG/ACT nasal spray Place 2 sprays into both nostrils daily. 04/12/16  Yes Historical Provider, MD  raloxifene (EVISTA) 60 MG tablet Take 60 mg by mouth daily. 02/27/16  Yes Historical Provider, MD  sennosides-docusate sodium (SENOKOT-S) 8.6-50 MG tablet Take 1 tablet by mouth 2 (two) times daily.   Yes  Historical Provider, MD  traMADol (ULTRAM) 50 MG tablet Take 25 mg by mouth 2 (two) times daily. Take 25 mg at bedtime (give 25 mg twice daily for pain).   Yes Historical Provider, MD    No family history on file.   Social History  Substance Use Topics  . Smoking status: Never Smoker  . Smokeless tobacco: Never Used  . Alcohol use No    Allergies as of 05/02/2016 - Review Complete 05/02/2016  Allergen Reaction Noted  . Propulsid [cisapride]  04/20/2016  . Septra [sulfamethoxazole-trimethoprim]  04/20/2016  . Trazodone and nefazodone  04/20/2016    Review of Systems:    All systems reviewed and negative except where noted in HPI.   Physical Exam:  Vital signs in last 24 hours: Temp:  [97.5 F (36.4 C)-98.1 F (36.7 C)] 98.1 F (36.7 C) (04/04 1510) Pulse Rate:  [79-81] 79 (04/04 1510) Resp:  [17-18] 18 (04/04 1510) BP: (123-156)/(56-71) 123/65 (04/04 1510) SpO2:  [98 %-100 %] 100 % (04/04 1510) Weight:  [100 lb (45.4 kg)-100 lb 12.8 oz (45.7 kg)] 100 lb 12.8 oz (45.7 kg) (04/04 1510) Last BM Date: 05/01/16 General:   Pleasant, cooperative in NAD Head:  Normocephalic and atraumatic. Eyes:   No icterus.   Conjunctiva pink. PERRLA. Ears:  Hard of hearing  Neck:  Supple; no masses or thyroidomegaly Lungs: Respirations even and unlabored. Lungs  clear to auscultation bilaterally.   No wheezes, crackles, or rhonchi.  Heart:  Regular rate and rhythm;  Without murmur, clicks, rubs or gallops Abdomen:  Soft, nondistended, nontender. Normal bowel sounds. No appreciable masses or hepatomegaly.  No rebound or guarding.  Rectal:  Not performed.Diaper had some dark red blood in it  Msk:  Symmetrical without gross deformities.   Extremities:  Without edema, cyanosis or clubbing. Neurologic:  Alert and oriented x3;  grossly normal neurologically. Psych:  Alert and cooperative. Normal affect.  LAB RESULTS:  Recent Labs  05/02/16 1010 05/02/16 1501  WBC 6.2  --   HGB 11.8* 11.4*    HCT 35.2 34.1*  PLT 343  --    BMET  Recent Labs  05/02/16 1010  NA 140  K 3.7  CL 109  CO2 22  GLUCOSE 116*  BUN 22*  CREATININE 0.90  CALCIUM 8.2*   LFT  Recent Labs  05/02/16 1010  PROT 6.4*  ALBUMIN 3.0*  AST 31  ALT 15  ALKPHOS 229*  BILITOT 0.5   PT/INR No results for input(s): LABPROT, INR in the last 72 hours.  STUDIES: Nm Gi Blood Loss  Result Date: 05/02/2016 CLINICAL DATA:  Complains of pain in nodular. Gastrointestinal hemorrhage. EXAM: NUCLEAR MEDICINE GASTROINTESTINAL BLEEDING SCAN TECHNIQUE: Sequential abdominal images were obtained following intravenous administration of Tc-63m labeled red blood cells. RADIOPHARMACEUTICALS:  24.6 mCi Tc-18m in-vitro labeled red cells. COMPARISON:  None. FINDINGS: No abnormal focus of increased radiotracer uptake identified to indicate site of gastrointestinal hemorrhage. Normal physiologic activity identified within the blood pool as well as the GU tract. IMPRESSION: The specific findings of via gastrointestinal hemorrhage are not identified at this time Electronically Signed   By: Signa Kell M.D.   On: 05/02/2016 14:33   Dg Abdomen Acute W/chest  Result Date: 05/02/2016 CLINICAL DATA:  Rectal bleeding starting today EXAM: DG ABDOMEN ACUTE W/ 1V CHEST COMPARISON:  04/18/2016 FINDINGS: Normal small bowel gas pattern. Moderate gas and abundant stool noted in right colon transverse colon proximal left colon. Moderate stool noted within rectum. The rectum measures 6.5 cm in diameter suspicious for mild fecal impaction. Again noted mild displaced fracture of the right superior and inferior pubic ramus adjacent to pubic symphysis. Cardiomediastinal silhouette is unremarkable. No infiltrate or pleural effusion. No pulmonary edema. IMPRESSION: Moderate gas and abundant stool noted in right colon transverse colon proximal left colon. Moderate stool noted within rectum. The rectum measures 6.5 cm in diameter suspicious for mild fecal  impaction. Again noted mild displaced fracture of the right superior and inferior pubic ramus adjacent to pubic symphysis. No acute disease within chest. Electronically Signed   By: Natasha Mead M.D.   On: 05/02/2016 11:20      Impression / Plan:   Sheri Porter is a 83 y.o. y/o female with sudden onset bright red blood per rectum-history suggestive of a painless diverticular bleed which has stopped (tagged RBC scan showed no active bleed) explained to patient and family the differentials of most likely diverticular bleed >>malignancy>> AVMS. She was really clear that she didn't want any procedures which is not unreasonable . She is aware of the differentials .   Plan  1. Clear liquid diet 2. If has a bleed (evidenced by bright red blood in rectum and drop in Hb) repeat tagged rbc scan   3. If Hb stable tomorrow can plan to d.c after her stools get clearer. She is not keen to clean out with golytely.  Thank you for involving me in the care of this patient.      LOS: 0 days   Wyline Mood, MD  05/02/2016, 4:50 PM

## 2016-05-02 NOTE — Plan of Care (Signed)
Problem: Skin Integrity: Goal: Risk for impaired skin integrity will decrease Outcome: Progressing Educated patient on peri care.

## 2016-05-02 NOTE — ED Triage Notes (Signed)
Per [facility], patient comes from the [place] with c/o rectal bleeding that began today. Hx of AAA. Hypotensive in triage. Dr. Shaune Pollack informed and at bedside.

## 2016-05-02 NOTE — ED Notes (Signed)
Nuclear medicine here to take patient to test.

## 2016-05-02 NOTE — ED Provider Notes (Signed)
[facility] Emergency Department Provider Note ____________________________________________   I have reviewed the triage vital signs and the triage nursing note.  HISTORY  Chief Complaint Rectal Bleeding   Historian Patient daughter  HPI Sheri Porter is a 83 y.o. female with history of abdominal aortic aneurysm, GERD, recent pelvic fracture for which she's been at skilled nursing for rehabilitation, presents with rectal bleeding, that began this morning.  Was hypotensive upon triage.  Denies abdominal pain. Denies nausea or vomiting. States that she "doesn't feel well."    Past Medical History:  Diagnosis Date  . Abdominal aortic aneurysm without rupture (HCC)   . Anxiety   . GERD (gastroesophageal reflux disease)   . Hypercholesteremia   . Hypertension   . Osteoporosis     Patient Active Problem List   Diagnosis Date Noted  . GIB (gastrointestinal bleeding) 05/02/2016  . Age-related osteoporosis with current pathological fracture of right femur with routine healing 04/28/2016    History reviewed. No pertinent surgical history.  Prior to Admission medications   Medication Sig Start Date End Date Taking? Authorizing Provider  acetaminophen (TYLENOL) 500 MG tablet Take 1,000 mg by mouth 3 (three) times daily.   Yes Historical Provider, MD  aspirin 325 MG EC tablet Take 325 mg by mouth 2 (two) times daily.   Yes Historical Provider, MD  Cholecalciferol (VITAMIN D3) 1000 units CAPS Take 4,000 Units by mouth daily.   Yes Historical Provider, MD  fluticasone (FLONASE) 50 MCG/ACT nasal spray Place 2 sprays into both nostrils daily. 04/12/16  Yes Historical Provider, MD  raloxifene (EVISTA) 60 MG tablet Take 60 mg by mouth daily. 02/27/16  Yes Historical Provider, MD  sennosides-docusate sodium (SENOKOT-S) 8.6-50 MG tablet Take 1 tablet by mouth 2 (two) times daily.   Yes Historical Provider, MD  traMADol (ULTRAM) 50 MG tablet Take 25 mg by mouth 2 (two)  times daily. Take 25 mg at bedtime (give 25 mg twice daily for pain).   Yes Historical Provider, MD    Allergies  Allergen Reactions  . Propulsid [Cisapride]   . Septra [Sulfamethoxazole-Trimethoprim]   . Trazodone And Nefazodone     No family history on file.  Social History Social History  Substance Use Topics  . Smoking status: Never Smoker  . Smokeless tobacco: Never Used  . Alcohol use No    Review of Systems  Constitutional: Negative for fever. Eyes: Negative for visual changes. ENT: Negative for sore throat. Cardiovascular: Negative for chest pain. Respiratory: Negative for shortness of breath. Gastrointestinal: Negative for vomiting. Fairly large amount of dark bloody clots per rectum today. Genitourinary: Negative for dysuria. Musculoskeletal: Negative for back pain. Skin: Negative for rash. Neurological: Negative for headache. 10 point Review of Systems otherwise negative ____________________________________________   PHYSICAL EXAM:  VITAL SIGNS: ED Triage Vitals  Enc Vitals Group     BP --      Pulse --      Resp --      Temp 05/02/16 0946 97.5 F (36.4 C)     Temp Source 05/02/16 0946 Oral     SpO2 --      Weight 05/02/16 0949 100 lb (45.4 kg)     Height 05/02/16 0949  (1.575 m)     Head Circumference --      Peak Flow --      Pain Score 05/02/16 0946 0     Pain Loc --      Pain Edu? --  Excl. in GC? --      Constitutional: Alert and oriented. Well appearing and in no distress. HEENT   Head: Normocephalic and atraumatic.      Eyes: Conjunctivae are normal. PERRL. Normal extraocular movements.      Ears:         Nose: No congestion/rhinnorhea.   Mouth/Throat: Mucous membranes are moist.   Neck: No stridor. Cardiovascular/Chest: Normal rate, regular rhythm.  No murmurs, rubs, or gallops. Respiratory: Normal respiratory effort without tachypnea nor retractions. Breath sounds are clear and equal bilaterally. No  wheezes/rales/rhonchi. Gastrointestinal: Soft. Distended and somewhat tight, patient is denying significant pain  Genitourinary/rectal: Dark red blood and clots from her rectum Musculoskeletal: Nontender with normal range of motion in all extremities. No joint effusions.  No lower extremity tenderness.  No edema. Neurologic:  Normal speech and language. No gross or focal neurologic deficits are appreciated. Skin:  Skin is warm, dry and intact. No rash noted. Psychiatric: Mood and affect are normal. Speech and behavior are normal. Patient exhibits appropriate insight and judgment.   ____________________________________________  LABS (pertinent positives/negatives)  Labs Reviewed  COMPREHENSIVE METABOLIC PANEL - Abnormal; Notable for the following:       Result Value   Glucose, Bld 116 (*)    BUN 22 (*)    Calcium 8.2 (*)    Total Protein 6.4 (*)    Albumin 3.0 (*)    Alkaline Phosphatase 229 (*)    GFR calc non Af Amer 56 (*)    All other components within normal limits  CBC WITH DIFFERENTIAL/PLATELET - Abnormal; Notable for the following:    Hemoglobin 11.8 (*)    All other components within normal limits  TROPONIN I  TYPE AND SCREEN    ____________________________________________    EKG I, Governor Rooks, MD, the attending physician have personally viewed and interpreted all ECGs.  71 bpm. Narrow QRS. Left axis deviation. Normal ST and T-wave ____________________________________________  RADIOLOGY All Xrays were viewed by me. Imaging interpreted by Radiologist.  Chest and abdomen:  IMPRESSION: Moderate gas and abundant stool noted in right colon transverse colon proximal left colon. Moderate stool noted within rectum. The rectum measures 6.5 cm in diameter suspicious for mild fecal impaction. Again noted mild displaced fracture of the right superior and inferior pubic ramus adjacent to pubic symphysis. No acute disease within  chest. __________________________________________  PROCEDURES  Procedure(s) performed: None  Critical Care performed: None  ____________________________________________   ED COURSE / ASSESSMENT AND PLAN  Pertinent labs & imaging results that were available during my care of the patient were reviewed by me and considered in my medical decision making (see chart for details).   Brought in for dark blood and clots per rectum. On exam she has a pad. Blood and she has some dark clots, seems to be more acutely rather than melena. I'm more suspicious of lower GI bleeding. Her belly seems a little distended and tense to me although she is not really reporting of significant pain.  She's never had GI bleeding.  Daughter is here.  Initial blood pressure low, repeat above 130. Patient did receive 1 L normal saline.  Type and screen was sent.  XRays show no free air.  Discussed with Dr. Juliene Pina who placed order for tagged rbc study.    CONSULTATIONS:   Dr. Juliene Pina, hospitalist for admission.   Patient / Family / Caregiver informed of clinical course, medical decision-making process, and agree with plan.    ___________________________________________   FINAL  CLINICAL IMPRESSION(S) / ED DIAGNOSES   Final diagnoses:  GIB (gastrointestinal bleeding)              Note: This dictation was prepared with Dragon dictation. Any transcriptional errors that result from this process are unintentional    Governor Rooks, MD 05/02/16 1216

## 2016-05-03 LAB — HEMOGLOBIN AND HEMATOCRIT, BLOOD
HCT: 27.4 % — ABNORMAL LOW (ref 35.0–47.0)
HEMATOCRIT: 32.4 % — AB (ref 35.0–47.0)
HEMATOCRIT: 27.6 % — AB (ref 35.0–47.0)
HEMOGLOBIN: 10.9 g/dL — AB (ref 12.0–16.0)
HEMOGLOBIN: 9.3 g/dL — AB (ref 12.0–16.0)
Hemoglobin: 9.2 g/dL — ABNORMAL LOW (ref 12.0–16.0)

## 2016-05-03 LAB — BASIC METABOLIC PANEL
ANION GAP: 4 — AB (ref 5–15)
BUN: 22 mg/dL — ABNORMAL HIGH (ref 6–20)
CALCIUM: 7.8 mg/dL — AB (ref 8.9–10.3)
CO2: 27 mmol/L (ref 22–32)
Chloride: 108 mmol/L (ref 101–111)
Creatinine, Ser: 0.76 mg/dL (ref 0.44–1.00)
GFR calc Af Amer: >60 mL/min (ref >60)
GFR calc non Af Amer: >60 mL/min (ref >60)
GLUCOSE: 89 mg/dL (ref 65–99)
POTASSIUM: 3.7 mmol/L (ref 3.5–5.1)
Sodium: 139 mmol/L (ref 135–145)

## 2016-05-03 LAB — CBC
HEMATOCRIT: 29.2 % — AB (ref 35.0–47.0)
HEMOGLOBIN: 9.9 g/dL — AB (ref 12.0–16.0)
MCH: 30.1 pg (ref 26.0–34.0)
MCHC: 33.9 g/dL (ref 32.0–36.0)
MCV: 88.6 fL (ref 80.0–100.0)
PLATELETS: 264 10*3/uL (ref 150–440)
RBC: 3.29 MIL/uL — ABNORMAL LOW (ref 3.80–5.20)
RDW: 13.2 % (ref 11.5–14.5)
WBC: 5.3 10*3/uL (ref 3.6–11.0)

## 2016-05-03 MED ORDER — PANTOPRAZOLE SODIUM 40 MG PO TBEC
40.0000 mg | DELAYED_RELEASE_TABLET | Freq: Two times a day (BID) | ORAL | Status: DC
  Administered 2016-05-04: 40 mg via ORAL
  Filled 2016-05-03: qty 1

## 2016-05-03 NOTE — Progress Notes (Signed)
[employer] - [place] [facility]   PATIENT NAME: Delaney Perona    MR#:  [id number]  DATE OF BIRTH:  [birth year]  SUBJECTIVE:  CHIEF COMPLAINT:   Chief Complaint  Patient presents with  . Rectal Bleeding  No further bleeding, hard of hearing, she prefers to be comfortable and not going for any intervention REVIEW OF SYSTEMS:  Review of Systems  Constitutional: Negative for chills, fever and weight loss.  HENT: Positive for hearing loss. Negative for nosebleeds and sore throat.   Eyes: Negative for blurred vision.  Respiratory: Negative for cough, shortness of breath and wheezing.   Cardiovascular: Negative for chest pain, orthopnea, leg swelling and PND.  Gastrointestinal: Positive for blood in stool. Negative for abdominal pain, constipation, diarrhea, heartburn, nausea and vomiting.  Genitourinary: Negative for dysuria and urgency.  Musculoskeletal: Negative for back pain.  Skin: Negative for rash.  Neurological: Negative for dizziness, speech change, focal weakness and headaches.  Endo/Heme/Allergies: Does not bruise/bleed easily.  Psychiatric/Behavioral: Negative for depression.    DRUG ALLERGIES:   Allergies  Allergen Reactions  . Propulsid [Cisapride]   . Septra [Sulfamethoxazole-Trimethoprim]   . Trazodone And Nefazodone    VITALS:  Blood pressure (!) 129/56, pulse 81, temperature 98.4 F (36.9 C), temperature source Oral, resp. rate 18, height 5' (1.524 m), weight 45.7 kg (100 lb 12.8 oz), SpO2 100 %. PHYSICAL EXAMINATION:  Physical Exam  Constitutional: She is oriented to person, place, and time and well-developed, well-nourished, and in no distress.  HENT:  Head: Normocephalic and atraumatic.  Eyes: Conjunctivae and EOM are normal. Pupils are equal, round, and reactive to light.  Neck: Normal range of motion. Neck supple. No tracheal deviation present. No thyromegaly present.  Cardiovascular: Normal rate, regular rhythm and normal heart  sounds.   Pulmonary/Chest: Effort normal and breath sounds normal. No respiratory distress. She has no wheezes. She exhibits no tenderness.  Abdominal: Soft. Bowel sounds are normal. She exhibits no distension. There is no tenderness.  Musculoskeletal: Normal range of motion.  Neurological: She is alert and oriented to person, place, and time. No cranial nerve deficit.  Skin: Skin is warm and dry. No rash noted.  Psychiatric: Mood and affect normal.   LABORATORY PANEL:  Female CBC  Recent Labs Lab 05/03/16 0239 05/03/16 0852  WBC 5.3  --   HGB 9.9* 10.9*  HCT 29.2* 32.4*  PLT 264  --    ------------------------------------------------------------------------------------------------------------------ Chemistries   Recent Labs Lab 05/02/16 1010 05/03/16 0239  NA 140 139  K 3.7 3.7  CL 109 108  CO2 22 27  GLUCOSE 116* 89  BUN 22* 22*  CREATININE 0.90 0.76  CALCIUM 8.2* 7.8*  AST 31  --   ALT 15  --   ALKPHOS 229*  --   BILITOT 0.5  --    RADIOLOGY:  Nm Gi Blood Loss  Result Date: 05/02/2016 CLINICAL DATA:  Complains of pain in nodular. Gastrointestinal hemorrhage. EXAM: NUCLEAR MEDICINE GASTROINTESTINAL BLEEDING SCAN TECHNIQUE: Sequential abdominal images were obtained following intravenous administration of Tc-87m labeled red blood cells. RADIOPHARMACEUTICALS:  24.6 mCi Tc-45m in-vitro labeled red cells. COMPARISON:  None. FINDINGS: No abnormal focus of increased radiotracer uptake identified to indicate site of gastrointestinal hemorrhage. Normal physiologic activity identified within the blood pool as well as the GU tract. IMPRESSION: The specific findings of via gastrointestinal hemorrhage are not identified at this time Electronically Signed   By: Signa Kell M.D.   On: 05/02/2016 14:33   ASSESSMENT  AND PLAN:  83 year old female presents with bright red blood per rectum.  1. Bright red blood per rectum: GI Dr Tobi Bastos recommends conservative management Transfuse if  vitals become unstable with hypotension and tachycardia or hemoglobin less than 7 Hold aspirin GIB scan negative  2. Recent pelvic fracture: On Dulcolax and Senokot-s  3. Benign hypertension with chronic kidney disease stage III: Patient is not on blood pressure medications  4. AAA: Patient follows with Dr. Wyn Quaker  5. Osteoporosis: Continue Evista      All the records are reviewed and case discussed with Care Management/Social Worker. Management plans discussed with the patient, family and they are in agreement.  CODE STATUS: DNR  TOTAL TIME TAKING CARE OF THIS PATIENT: 20 minutes.   More than 50% of the time was spent in counseling/coordination of care: YES  POSSIBLE D/C IN 1-2 DAYS, DEPENDING ON CLINICAL CONDITION.   Delfino Lovett M.D on 05/03/2016 at 2:08 PM  Between 7am to 6pm - Pager - [phone]  After 6pm go to [email or web address] - Social research officer, government  [employer] [facility]  Office  [phone]  CC: Primary care physician; Lauro Regulus., MD  Note: This dictation was prepared with Dragon dictation along with smaller phrase technology. Any transcriptional errors that result from this process are unintentional.

## 2016-05-03 NOTE — Progress Notes (Signed)
Wyline Mood MD [address] [place], [place] [address] Phone: [phone] Fax : [phone]  Sheri Porter is being followed for rectal bleeding  Day 2 of follow up   Subjective: Says stools turning lighter, no bright red blood per rectum    Objective: Vital signs in last 24 hours: Vitals:   05/02/16 1100 05/02/16 1510 05/02/16 2041 05/03/16 0457  BP: (!) 149/63 123/65 (!) 146/64 (!) 150/63  Pulse:  79 93 79  Resp:  Temp:  98.1 F (36.7 C) 98.4 F (36.9 C) 98 F (36.7 C)  TempSrc:  Oral Oral Oral  SpO2:  100% 100% 100%  Weight:  100 lb 12.8 oz (45.7 kg)    Height:  5' (1.524 m)     Weight change:   Intake/Output Summary (Last 24 hours) at 05/03/16 6962 Last data filed at 05/03/16 0556  Gross per 24 hour  Intake             1075 ml  Output                0 ml  Net             1075 ml     Exam: Heart:: S1S2 present or without murmur or extra heart sounds Lungs: normal, clear to auscultation and clear to auscultation and percussion Abdomen: soft, nontender, normal bowel sounds   Lab Results: @ Micro Results: Recent Results (from the past 240 hour(s))  MRSA PCR Screening     Status: None   Collection Time: 05/02/16  3:52 PM  Result Value Ref Range Status   MRSA by PCR NEGATIVE NEGATIVE Final    Comment:        The GeneXpert MRSA Assay (FDA approved for NASAL specimens only), is one component of a comprehensive MRSA colonization surveillance program. It is not intended to diagnose MRSA infection nor to guide or monitor treatment for MRSA infections.    Studies/Results: Nm Gi Blood Loss  Result Date: 05/02/2016 CLINICAL DATA:  Complains of pain in nodular. Gastrointestinal hemorrhage. EXAM: NUCLEAR MEDICINE GASTROINTESTINAL BLEEDING SCAN TECHNIQUE: Sequential abdominal images were obtained following intravenous administration of Tc-70m labeled red blood cells. RADIOPHARMACEUTICALS:  24.6 mCi Tc-51m in-vitro labeled red cells.  COMPARISON:  None. FINDINGS: No abnormal focus of increased radiotracer uptake identified to indicate site of gastrointestinal hemorrhage. Normal physiologic activity identified within the blood pool as well as the GU tract. IMPRESSION: The specific findings of via gastrointestinal hemorrhage are not identified at this time Electronically Signed   By: Signa Kell M.D.   On: 05/02/2016 14:33   Dg Abdomen Acute W/chest  Result Date: 05/02/2016 CLINICAL DATA:  Rectal bleeding starting today EXAM: DG ABDOMEN ACUTE W/ 1V CHEST COMPARISON:  04/18/2016 FINDINGS: Normal small bowel gas pattern. Moderate gas and abundant stool noted in right colon transverse colon proximal left colon. Moderate stool noted within rectum. The rectum measures 6.5 cm in diameter suspicious for mild fecal impaction. Again noted mild displaced fracture of the right superior and inferior pubic ramus adjacent to pubic symphysis. Cardiomediastinal silhouette is unremarkable. No infiltrate or pleural effusion. No pulmonary edema. IMPRESSION: Moderate gas and abundant stool noted in right colon transverse colon proximal left colon. Moderate stool noted within rectum. The rectum measures 6.5 cm in diameter suspicious for mild fecal impaction. Again noted mild displaced fracture of the right superior and inferior pubic ramus adjacent to pubic symphysis. No acute disease within chest. Electronically Signed   By:  Natasha Mead M.D.   On: 05/02/2016 11:20   Medications: I have reviewed the patient's current medications. Scheduled Meds: . acetaminophen  1,000 mg Oral TID  . cholecalciferol  4,000 Units Oral Daily  . fluticasone  2 spray Each Nare Daily  . pantoprazole (PROTONIX) IV  40 mg Intravenous Q12H  . pneumococcal 23 valent vaccine  0.5 mL Intramuscular Tomorrow-1000  . raloxifene  60 mg Oral Daily  . senna-docusate  1 tablet Oral BID   Continuous Infusions: . sodium chloride 75 mL/hr at 05/02/16 1530   PRN Meds:.acetaminophen **OR**  acetaminophen, bisacodyl, ketorolac, ondansetron **OR** ondansetron (ZOFRAN) IV, senna-docusate CBC Latest Ref Rng & Units 05/03/2016 05/02/2016 05/02/2016  WBC 3.6 - 11.0 K/uL 5.3 - -  Hemoglobin 12.0 - 16.0 g/dL 8.1(X) 9.1(Y) 11.4(L)  Hematocrit 35.0 - 47.0 % 29.2(L) 28.7(L) 34.1(L)  Platelets 150 - 440 K/uL 264 - -     Assessment: Active Problems:   GIB (gastrointestinal bleeding) Sheri Porter is a 83 y.o. y/o female with sudden onset bright red blood per rectum-history suggestive of a painless diverticular bleed which has stopped (tagged RBC scan showed no active bleed) explained to patient and family the differentials of most likely diverticular bleed >>malignancy>> AVM's. She is not keen on any procedures. Hb stable and no overt signs of bleeding    Plan: 1. Advance diet , recheck Hb in the evening and if ok can go home with follow up with pcp to recheck hb in a few days to ensure stable. Patient been advised if she has bright red blood per rectum to return to the hospital.   I will sign off.  Please call me if any further GI concerns or questions.  We would like to thank you for the opportunity to participate in the care of Sheri Porter.    LOS: 1 day   Wyline Mood 05/03/2016, 7:23 AM

## 2016-05-03 NOTE — Progress Notes (Signed)
Family Meeting Note  Advance Directive:no  Today a meeting took place with the Patient and Son in Social worker.  The following clinical team members were present during this meeting:MD  The following were discussed:Patient's diagnosis: , Patient's progosis: > 12 months and Goals for treatment: DNR  Additional follow-up to be provided: Palliative care eval - consider Palliative care to follow while at home with transition to Hospice if she qualifies. May need both daughters on board.  Time spent during discussion:20 minutes  Delfino Lovett, MD

## 2016-05-03 NOTE — Progress Notes (Signed)
Patient resting in bed. Family present. Notified MD about diet order and IV removed by patient. Acknowledged. Ok to leave IV out

## 2016-05-04 DIAGNOSIS — Z7189 Other specified counseling: Secondary | ICD-10-CM

## 2016-05-04 DIAGNOSIS — Z515 Encounter for palliative care: Secondary | ICD-10-CM

## 2016-05-04 LAB — CBC
HEMATOCRIT: 27.4 % — AB (ref 35.0–47.0)
Hemoglobin: 9.3 g/dL — ABNORMAL LOW (ref 12.0–16.0)
MCH: 30.2 pg (ref 26.0–34.0)
MCHC: 33.8 g/dL (ref 32.0–36.0)
MCV: 89.3 fL (ref 80.0–100.0)
Platelets: 237 10*3/uL (ref 150–440)
RBC: 3.07 MIL/uL — AB (ref 3.80–5.20)
RDW: 13.5 % (ref 11.5–14.5)
WBC: 3.6 10*3/uL (ref 3.6–11.0)

## 2016-05-04 LAB — BASIC METABOLIC PANEL
ANION GAP: 5 (ref 5–15)
BUN: 26 mg/dL — AB (ref 6–20)
CALCIUM: 8 mg/dL — AB (ref 8.9–10.3)
CO2: 26 mmol/L (ref 22–32)
Chloride: 109 mmol/L (ref 101–111)
Creatinine, Ser: 0.77 mg/dL (ref 0.44–1.00)
GFR calc Af Amer: >60 mL/min (ref >60)
GFR calc non Af Amer: >60 mL/min (ref >60)
GLUCOSE: 102 mg/dL — AB (ref 65–99)
Potassium: 3.8 mmol/L (ref 3.5–5.1)
Sodium: 140 mmol/L (ref 135–145)

## 2016-05-04 MED ORDER — PANTOPRAZOLE SODIUM 40 MG PO TBEC: 40.0000 mg | DELAYED_RELEASE_TABLET | Freq: Two times a day (BID) | ORAL | 0 refills | Status: AC

## 2016-05-04 MED ORDER — SENNA-DOCUSATE SODIUM 8.6-50 MG PO TABS: 1.0000 | ORAL_TABLET | Freq: Two times a day (BID) | ORAL | 0 refills | Status: AC | PRN

## 2016-05-04 NOTE — Progress Notes (Signed)
Patient discharged to home. Concerns addressed. Prescriptions given to patient daughter. Discharge summary discussed with patient family.

## 2016-05-04 NOTE — Care Management (Signed)
Patient admitted with GI bleed.  Patient recently discharge from [facility].  Patient to discharge home.  Patient will have family staying with her at all times.  Patient has RW in the home for ambulation.  PCP Dareen Piano.  Home health orders have been placed for PT and BSC.  Patient and family were provided with home health agency preference.  Family states that they do not have an agency preference.  Referral made to [facility] with [facility].  BSC to be delivered prior to discharge.  RNCM signing off.

## 2016-05-04 NOTE — Discharge Summary (Signed)
[facility] [place] [facility]   PATIENT NAME: Sheri Porter    MR#:  [id number]  DATE OF BIRTH:  09/22/1926  DATE OF ADMISSION:  05/02/2016   ADMITTING PHYSICIAN: Adrian Saran, MD  DATE OF DISCHARGE: 05/04/2016 12:51 PM  PRIMARY CARE PHYSICIAN: Lauro Regulus., MD   ADMISSION DIAGNOSIS:  GIB (gastrointestinal bleeding) [K92.2] DISCHARGE DIAGNOSIS:  Active Problems:   GIB (gastrointestinal bleeding)  SECONDARY DIAGNOSIS:   Past Medical History:  Diagnosis Date  . Abdominal aortic aneurysm without rupture (HCC)   . Anxiety   . GERD (gastroesophageal reflux disease)   . Hypercholesteremia   . Hypertension   . Osteoporosis    HOSPITAL COURSE:  83 year old female presents with bright red blood per rectum.  1. Bright red blood per rectum: GI Dr Tobi Bastos recommends conservative management No further bleeding while inpt. GIB scan negative. Recommended to hold asa, tramadol. Family in agreement. DISCHARGE CONDITIONS:  stable CONSULTS OBTAINED:   DRUG ALLERGIES:   Allergies  Allergen Reactions  . Propulsid [Cisapride]   . Septra [Sulfamethoxazole-Trimethoprim]   . Trazodone And Nefazodone    DISCHARGE MEDICATIONS:   Allergies as of 05/04/2016      Reactions   Propulsid [cisapride]    Septra [sulfamethoxazole-trimethoprim]    Trazodone And Nefazodone       Medication List    STOP taking these medications   aspirin 325 MG EC tablet   traMADol 50 MG tablet Commonly known as:  ULTRAM     TAKE these medications   acetaminophen 500 MG tablet Commonly known as:  TYLENOL Take 1,000 mg by mouth 3 (three) times daily.   fluticasone 50 MCG/ACT nasal spray Commonly known as:  FLONASE Place 2 sprays into both nostrils daily.   pantoprazole 40 MG tablet Commonly known as:  PROTONIX Take 1 tablet (40 mg total) by mouth 2 (two) times daily.   raloxifene 60 MG tablet Commonly known as:  EVISTA Take 60 mg by mouth daily.   sennosides-docusate  sodium 8.6-50 MG tablet Commonly known as:  SENOKOT-S Take 1 tablet by mouth 2 (two) times daily as needed for constipation. What changed:  when to take this  reasons to take this   Vitamin D3 1000 units Caps Take 4,000 Units by mouth daily.        DISCHARGE INSTRUCTIONS:   DIET:  Regular diet DISCHARGE CONDITION:  Good ACTIVITY:  Activity as tolerated OXYGEN:  Home Oxygen: No.  Oxygen Delivery: room air DISCHARGE LOCATION:  home with home health  If you experience worsening of your admission symptoms, develop shortness of breath, life threatening emergency, suicidal or homicidal thoughts you must seek medical attention immediately by calling 911 or calling your MD immediately  if symptoms less severe.  You Must read complete instructions/literature along with all the possible adverse reactions/side effects for all the Medicines you take and that have been prescribed to you. Take any new Medicines after you have completely understood and accpet all the possible adverse reactions/side effects.   Please note  You were cared for by a hospitalist during your hospital stay. If you have any questions about your discharge medications or the care you received while you were in the hospital after you are discharged, you can call the unit and asked to speak with the hospitalist on call if the hospitalist that took care of you is not available. Once you are discharged, your primary care physician will handle any further medical issues. Please note that NO REFILLS  for any discharge medications will be authorized once you are discharged, as it is imperative that you return to your primary care physician (or establish a relationship with a primary care physician if you do not have one) for your aftercare needs so that they can reassess your need for medications and monitor your lab values.    On the day of Discharge:  VITAL SIGNS:  Blood pressure (!) 143/62, pulse 78, temperature 98.1 F  (36.7 C), temperature source Oral, resp. rate 18, height 5' (1.524 m), weight 45.7 kg (100 lb 12.8 oz), SpO2 99 %. PHYSICAL EXAMINATION:  GENERAL:  83 y.o.-year-old patient lying in the bed with no acute distress.  EYES: Pupils equal, round, reactive to light and accommodation. No scleral icterus. Extraocular muscles intact.  HEENT: Head atraumatic, normocephalic. Oropharynx and nasopharynx clear.  NECK:  Supple, no jugular venous distention. No thyroid enlargement, no tenderness.  LUNGS: Normal breath sounds bilaterally, no wheezing, rales,rhonchi or crepitation. No use of accessory muscles of respiration.  CARDIOVASCULAR: S1, S2 normal. No murmurs, rubs, or gallops.  ABDOMEN: Soft, non-tender, non-distended. Bowel sounds present. No organomegaly or mass.  EXTREMITIES: No pedal edema, cyanosis, or clubbing.  NEUROLOGIC: Cranial nerves II through XII are intact. Muscle strength 5/5 in all extremities. Sensation intact. Gait not checked.  PSYCHIATRIC: The patient is alert and oriented x 3.  SKIN: No obvious rash, lesion, or ulcer.  DATA REVIEW:   CBC  Recent Labs Lab 05/04/16 0425  WBC 3.6  HGB 9.3*  HCT 27.4*  PLT 237    Chemistries   Recent Labs Lab 05/02/16 1010  05/04/16 0425  NA 140  < > 140  K 3.7  < > 3.8  CL 109  < > 109  CO2 22  < > 26  GLUCOSE 116*  < > 102*  BUN 22*  < > 26*  CREATININE 0.90  < > 0.77  CALCIUM 8.2*  < > 8.0*  AST 31  --   --   ALT 15  --   --   ALKPHOS 229*  --   --   BILITOT 0.5  --   --   < > = values in this interval not displayed.     Management plans discussed with the patient, family and they are in agreement.  CODE STATUS: Prior   TOTAL TIME TAKING CARE OF THIS PATIENT: 45 minutes.    Delfino Lovett M.D on 05/04/2016 at 8:58 PM  Between 7am to 6pm - Pager - [phone]  After 6pm go to [email or web address] - Social research officer, government  [facility] [place] [facility]  Office  [phone]  CC: Primary care physician;  Lauro Regulus., MD   Note: This dictation was prepared with Dragon dictation along with smaller phrase technology. Any transcriptional errors that result from this process are unintentional.

## 2016-05-04 NOTE — Care Management Important Message (Signed)
Important Message  Patient Details  Name: IMANIE DARROW MRN: [id number] Date of Birth: 1926-06-20   Medicare Important Message Given:  N/A - LOS <3 / Initial given by admissions    Chapman Fitch, RN 05/04/2016, 11:11 AM

## 2016-05-04 NOTE — Clinical Social Work Note (Signed)
CSW consulted for New SNF. Pt was admitted from [facility]. Per facility, pt was about to complete therapy and return home with home health services. Pt is ready for discharge and will return home with health services. RNCM is following for discharge planning needs. CSW is signing off as no further needs identified.   Dede Query, MSW, LCSW  Clinical Social Worker  [phone]

## 2016-05-04 NOTE — Consult Note (Signed)
Consultation Note Date: 05/04/2016   Patient Name: Sheri Porter  DOB: 03/23/1926  MRN: [id number]  Age / Sex: 83 y.o., female  PCP: Kirk Ruths, MD Referring Physician: Max Sane, MD  Reason for Consultation: Establishing goals of care  HPI/Patient Profile: 83 y.o. female  with past medical history of osteoporosis, hypertension, hyperlipidemia, GERD, anxiety, AAA, and diverticular bleed admitted on 05/02/2016 with abdominal pain and rectal bleeding. Patient with recent pelvic fracture receiving rehab. RBC scan revealed no active bleeding. Hemoglobin remained stable throughout hospitalization. GI evaluated and patient/family decline colonoscopy at this time. Palliative medicine consultation for goals of care.    Clinical Assessment and Goals of Care: I have reviewed medical records, discussed with Dr. Manuella Ghazi, and met with patient, daughter, and son-in-law at bedside to diagnosis, [place], EOL wishes, disposition and options.  I introduced Palliative Medicine as specialized medical care for people living with serious illness. It focuses on providing relief from the symptoms and stress of a serious illness. The goal is to improve quality of life for both the patient and the family.  The patient has been living at home independently. She was driving up until 6 weeks ago. Family very supportive and checks on her three times a day. Deny falls--osteoporosis likely reason for fracture. She has been at rehab facility and doing well and with good appetite.   Discussed current hospitalization. Patient and family opt against doing invasive interventions such as colonoscopy. GIB is resolving and she has been tolerating soft diet.    Advanced directives, concepts specific to code status, and artifical feeding and hydration were considered and discussed. Daughter confirms DNR/DNI and shows me the patient's living will--no life  prolonging measures including feeding tube. Discussed and educated on MOST form. Daughter, [name], is POA but does not make decisions without her sisters input. She plans to discuss and complete MOST form.    Palliative Care services outpatient were explained and offered. Brochure provided. [facility] copy left for review. Family appreciative of consultation.   Plan is for discharge home today with physical therapy.    SUMMARY OF RECOMMENDATIONS    DNR/DNI  Living will scanned and placed in chart. Patient/family confirm no life-prolonging measures if she should decline.   Educated on MOST form and encouraged they complete in the future.   Palliative brochure given--family understands home palliative services available when they are ready.   Plan is home with physical therapy. Likely discharge today.   Code Status/Advance Care Planning:  DNR   Symptom Management:   Per attending  Palliative Prophylaxis:   Aspiration, Delirium Protocol, Frequent Pain Assessment, Oral Care and Turn Reposition  Psycho-social/Spiritual:   Desire for further Chaplaincy support:no  Additional Recommendations: Caregiving  Support/Resources  Prognosis:   > 12 months  Discharge Planning: Home with Home Health      Primary Diagnoses: Present on Admission: . GIB (gastrointestinal bleeding)   I have reviewed the medical record, interviewed the patient and family, and examined the patient. The following aspects are pertinent.  Past Medical History:  Diagnosis Date  . Abdominal aortic aneurysm without rupture ([place])   . Anxiety   . GERD (gastroesophageal reflux disease)   . Hypercholesteremia   . Hypertension   . Osteoporosis    Social History   Social History  . Marital status: Married    Spouse name: N/A  . Number of children: N/A  . Years of education: N/A   Social History Main Topics  . Smoking status: Never Smoker  . Smokeless tobacco: Never Used  . Alcohol use No  .  Drug use: No  . Sexual activity: Not Asked   Other Topics Concern  . None   Social History Narrative  . None   No family history on file. Scheduled Meds: . acetaminophen  1,000 mg Oral TID  . cholecalciferol  4,000 Units Oral Daily  . fluticasone  2 spray Each Nare Daily  . pantoprazole  40 mg Oral BID  . pneumococcal 23 valent vaccine  0.5 mL Intramuscular Tomorrow-1000  . raloxifene  60 mg Oral Daily  . senna-docusate  1 tablet Oral BID   Continuous Infusions: . sodium chloride Stopped (05/03/16 1900)   PRN Meds:.acetaminophen **OR** acetaminophen, bisacodyl, ketorolac, ondansetron **OR** ondansetron (ZOFRAN) IV, senna-docusate Medications Prior to Admission:  Prior to Admission medications   Medication Sig Start Date End Date Taking? Authorizing Provider  acetaminophen (TYLENOL) 500 MG tablet Take 1,000 mg by mouth 3 (three) times daily.   Yes Historical Provider, MD  aspirin 325 MG EC tablet Take 325 mg by mouth 2 (two) times daily.   Yes Historical Provider, MD  Cholecalciferol (VITAMIN D3) 1000 units CAPS Take 4,000 Units by mouth daily.   Yes Historical Provider, MD  fluticasone (FLONASE) 50 MCG/ACT nasal spray Place 2 sprays into both nostrils daily. 04/12/16  Yes Historical Provider, MD  raloxifene (EVISTA) 60 MG tablet Take 60 mg by mouth daily. 02/27/16  Yes Historical Provider, MD  sennosides-docusate sodium (SENOKOT-S) 8.6-50 MG tablet Take 1 tablet by mouth 2 (two) times daily.   Yes Historical Provider, MD  traMADol (ULTRAM) 50 MG tablet Take 25 mg by mouth 2 (two) times daily. Take 25 mg at bedtime (give 25 mg twice daily for pain).   Yes Historical Provider, MD   Allergies  Allergen Reactions  . Propulsid [Cisapride]   . Septra [Sulfamethoxazole-Trimethoprim]   . Trazodone And Nefazodone    Review of Systems  Constitutional: Positive for activity change.  Gastrointestinal: Positive for blood in stool.  All other systems reviewed and are negative.  Physical  Exam  Constitutional: She is oriented to person, place, and time. She is cooperative.  HENT:  Head: Normocephalic and atraumatic.  Cardiovascular: Regular rhythm.   Pulmonary/Chest: Effort normal and breath sounds normal.  Abdominal: Normal appearance and bowel sounds are normal.  Neurological: She is alert and oriented to person, place, and time.  Skin: Skin is warm and dry.  Nursing note and vitals reviewed.  Vital Signs: BP (!) 143/62 (BP Location: Left Arm)   Pulse 78   Temp 98.1 F (36.7 C) (Oral)   Resp 18   Ht 5' (1.524 m)   Wt 45.7 kg (100 lb 12.8 oz)   SpO2 99%   BMI 19.69 kg/m  Pain Assessment: No/denies pain   Pain Score: 0-No pain  SpO2: SpO2: 99 % O2 Device:SpO2: 99 % O2 Flow Rate: .   IO: Intake/output summary:   Intake/Output Summary (Last 24 hours) at 05/04/16 0929 Last data filed at 05/04/16 [phone]  Gross per 24 hour  Intake              240 ml  Output                0 ml  Net              240 ml    LBM: Last BM Date: 05/03/16 Baseline Weight: Weight: 45.4 kg (100 lb) Most recent weight: Weight: 45.7 kg (100 lb 12.8 oz)     Palliative Assessment/Data: PPS 50%   Flowsheet Rows     Most Recent Value  Intake Tab  Referral Department  Hospitalist  Unit at Time of Referral  Med/Surg Unit  Palliative Care Primary Diagnosis  Other (Comment) [GI Bleed]  Date Notified  05/03/16  Palliative Care Type  New Palliative care  Reason for referral  Clarify Goals of Care  Date of Admission  05/02/16  Date first seen by Palliative Care  05/04/16  # of days IP prior to Palliative referral  1  Clinical Assessment  Palliative Performance Scale Score  50%  Psychosocial & Spiritual Assessment  Palliative Care Outcomes  Patient/Family meeting held?  Yes  Who was at the meeting?  patient, daughter, son-in-law  Palliative Care Outcomes  Clarified goals of care, Provided psychosocial or spiritual support, ACP counseling assistance, Linked to palliative care  logitudinal support      Time In: 0845 Time Out: 0955 Time Total: 75mn Greater than 50%  of this time was spent counseling and coordinating care related to the above assessment and plan.  Signed by:  MIhor Dow FNP-C Palliative Medicine Team  Phone: [phone]: [phone]  Please contact Palliative Medicine Team phone at [phone] questions and concerns.  For individual provider: See AShea Evans

## 2016-05-09 NOTE — Care Management (Signed)
Notified by daughter Jan home health services were already in place through [facility].  Patient has been seen and evaluated post discharge

## 2016-05-10 DIAGNOSIS — Z7189 Other specified counseling: Secondary | ICD-10-CM

## 2016-05-10 DIAGNOSIS — Z515 Encounter for palliative care: Secondary | ICD-10-CM

## 2017-07-09 ENCOUNTER — Other Ambulatory Visit: Payer: Self-pay

## 2017-07-09 ENCOUNTER — Emergency Department: Payer: Medicare Other

## 2017-07-09 ENCOUNTER — Encounter: Payer: Self-pay | Admitting: Emergency Medicine

## 2017-07-09 ENCOUNTER — Emergency Department
Admission: EM | Admit: 2017-07-09 | Discharge: 2017-07-09 | Disposition: A | Discharge status: Home / Self Care | Payer: Medicare Other | Attending: Emergency Medicine | Admitting: Emergency Medicine

## 2017-07-09 DIAGNOSIS — R109 Unspecified abdominal pain: Secondary | ICD-10-CM | POA: Insufficient documentation

## 2017-07-09 DIAGNOSIS — I10 Essential (primary) hypertension: Secondary | ICD-10-CM | POA: Insufficient documentation

## 2017-07-09 DIAGNOSIS — R238 Other skin changes: Secondary | ICD-10-CM | POA: Insufficient documentation

## 2017-07-09 DIAGNOSIS — Z79899 Other long term (current) drug therapy: Secondary | ICD-10-CM | POA: Insufficient documentation

## 2017-07-09 DIAGNOSIS — R230 Cyanosis: Secondary | ICD-10-CM

## 2017-07-09 LAB — LIPASE, BLOOD: Lipase: 45 U/L (ref 11–51)

## 2017-07-09 LAB — COMPREHENSIVE METABOLIC PANEL
ALBUMIN: 3.7 g/dL (ref 3.5–5.0)
ALT: 18 U/L (ref 14–54)
AST: 30 U/L (ref 15–41)
Alkaline Phosphatase: 70 U/L (ref 38–126)
Anion gap: 11 (ref 5–15)
BILIRUBIN TOTAL: 0.3 mg/dL (ref 0.3–1.2)
BUN: 27 mg/dL — AB (ref 6–20)
CHLORIDE: 102 mmol/L (ref 101–111)
CO2: 27 mmol/L (ref 22–32)
CREATININE: 1.28 mg/dL — AB (ref 0.44–1.00)
Calcium: 8.5 mg/dL — ABNORMAL LOW (ref 8.9–10.3)
GFR calc Af Amer: 42 mL/min — ABNORMAL LOW (ref >60)
GFR, EST NON AFRICAN AMERICAN: 36 mL/min — AB (ref >60)
GLUCOSE: 97 mg/dL (ref 65–99)
Potassium: 3.9 mmol/L (ref 3.5–5.1)
Sodium: 140 mmol/L (ref 135–145)
Total Protein: 7.2 g/dL (ref 6.5–8.1)

## 2017-07-09 LAB — CBC
HCT: 34.8 % — ABNORMAL LOW (ref 35.0–47.0)
Hemoglobin: 11.8 g/dL — ABNORMAL LOW (ref 12.0–16.0)
MCH: 29.7 pg (ref 26.0–34.0)
MCHC: 34 g/dL (ref 32.0–36.0)
MCV: 87.4 fL (ref 80.0–100.0)
PLATELETS: 214 10*3/uL (ref 150–440)
RBC: 3.98 MIL/uL (ref 3.80–5.20)
RDW: 13.6 % (ref 11.5–14.5)
WBC: 5.3 10*3/uL (ref 3.6–11.0)

## 2017-07-09 LAB — TROPONIN I: Troponin I: <0.03 ng/mL (ref <0.03)

## 2017-07-09 NOTE — ED Provider Notes (Signed)
[facility] Emergency Department Provider Note  ____________________________________________   First MD Initiated Contact with Patient 07/09/17 2108     (approximate)  I have reviewed the triage vital signs and the nursing notes.   HISTORY  Chief Complaint Abdominal Pain and Leg Swelling   HPI Sheri Porter is a 84 y.o. female who is brought to the emergency department by family for blue discoloration to her right leg that they noted today.  She does report some mild abdominal discomfort but this is chronic and not the reason why she came to the emergency department today.  She does have a small AAA that is followed as an outpatient.  No fevers or chills.  No trauma.  She is wearing new jeans that have not yet been washed.  Past Medical History:  Diagnosis Date  . Abdominal aortic aneurysm without rupture (HCC)   . Anxiety   . GERD (gastroesophageal reflux disease)   . Hypercholesteremia   . Hypertension   . Osteoporosis     Patient Active Problem List   Diagnosis Date Noted  . Palliative care by specialist   . Goals of care, counseling/discussion   . GIB (gastrointestinal bleeding) 05/02/2016  . Age-related osteoporosis with current pathological fracture of right femur with routine healing 04/28/2016    History reviewed. No pertinent surgical history.  Prior to Admission medications   Medication Sig Start Date End Date Taking? Authorizing Provider  acetaminophen (TYLENOL) 500 MG tablet Take 1,000 mg by mouth 3 (three) times daily.    [provider]  Cholecalciferol (VITAMIN D3) 1000 units CAPS Take 4,000 Units by mouth daily.    [provider]  fluticasone (FLONASE) 50 MCG/ACT nasal spray Place 2 sprays into both nostrils daily. 04/12/16   [provider]  pantoprazole (PROTONIX) 40 MG tablet Take 1 tablet (40 mg total) by mouth 2 (two) times daily. 05/04/16   Delfino Lovett, MD  raloxifene (EVISTA) 60 MG tablet Take 60 mg  by mouth daily. 02/27/16   [provider]  sennosides-docusate sodium (SENOKOT-S) 8.6-50 MG tablet Take 1 tablet by mouth 2 (two) times daily as needed for constipation. 05/04/16   Delfino Lovett, MD    Allergies Propulsid [cisapride]; Septra [sulfamethoxazole-trimethoprim]; and Trazodone and nefazodone  History reviewed. No pertinent family history.  Social History Social History   Tobacco Use  . Smoking status: Never Smoker  . Smokeless tobacco: Never Used  Substance Use Topics  . Alcohol use: No  . Drug use: No    Review of Systems Constitutional: No fever/chills Cardiovascular: Denies chest pain. Respiratory: Denies shortness of breath. Gastrointestinal: Positive for abdominal pain.  No nausea, no vomiting.  No diarrhea.  No constipation. Musculoskeletal: Negative for leg pain or swelling. Skin: Positive for rash. Neurological: Negative for headaches   ____________________________________________   PHYSICAL EXAM:  VITAL SIGNS: ED Triage Vitals  Enc Vitals Group     BP 07/09/17 1858 (!) 184/71     Pulse Rate 07/09/17 1858 78     Resp 07/09/17 1858 18     Temp 07/09/17 1858 98.1 F (36.7 C)     Temp Source 07/09/17 1858 Oral     SpO2 07/09/17 1858 98 %     Weight 07/09/17 1854 110 lb (49.9 kg)     Height 07/09/17 1854 5' (1.524 m)     Head Circumference --      Peak Flow --      Pain Score 07/09/17 1854 0  Pain Loc --      Pain Edu? --      Excl. in GC? --     Constitutional: Pleasant cooperative no distress Head: Atraumatic. Nose: No congestion/rhinnorhea. Mouth/Throat: No trismus Neck: No stridor.   Cardiovascular: Normal rate, regular rhythm.  Respiratory: Normal respiratory effort.  No retractions.  Musculoskeletal: No lower extremity edema   Neurologic:   No gross focal neurologic deficits are appreciated. Skin: Right leg with blue discoloration that easily washes off.    ____________________________________________   DIFFERENTIAL  includes but not limited to  DVT, sepsis, gangrene, arterial occlusion ____________________________________________   LABS (all labs ordered are listed, but only abnormal results are displayed)  Labs Reviewed  COMPREHENSIVE METABOLIC PANEL - Abnormal; Notable for the following components:      Result Value   BUN 27 (*)    Creatinine, Ser 1.28 (*)    Calcium 8.5 (*)    GFR calc non Af Amer 36 (*)    GFR calc Af Amer 42 (*)    All other components within normal limits  CBC - Abnormal; Notable for the following components:   Hemoglobin 11.8 (*)    HCT 34.8 (*)    All other components within normal limits  LIPASE, BLOOD  TROPONIN I    Lab work reviewed by me with no acute disease __________________________________________  EKG  ED ECG REPORT I, Merrily BrittleNeil Mena Simonis, the attending physician, personally viewed and interpreted this ECG.  Date: 07/09/2017 EKG Time:  Rate: 73 Rhythm: normal sinus rhythm QRS Axis: Leftward axis Intervals: normal ST/T Wave abnormalities: normal Narrative Interpretation: no evidence of acute ischemia  ____________________________________________  RADIOLOGY  Ultrasound of the right lower extremity reviewed by me with no evidence of DVT ____________________________________________   PROCEDURES  Procedure(s) performed: no  Procedures  Critical Care performed: no  Observation: no ____________________________________________   INITIAL IMPRESSION / ASSESSMENT AND PLAN / ED COURSE  Pertinent labs & imaging results that were available during my care of the patient were reviewed by me and considered in my medical decision making (see chart for details).  The patient has bluish discoloration from Eugene's.  Lab work EKG and ultrasound are unremarkable.  No signs of arterial occlusion.  She did not come for abdominal pain and is not concerned about the AAA.  Family comfortable taking the patient home and following up as an outpatient.       ____________________________________________   FINAL CLINICAL IMPRESSION(S) / ED DIAGNOSES  Final diagnoses:  Bluish skin discoloration      NEW MEDICATIONS STARTED DURING THIS VISIT:  Discharge Medication List as of 07/09/2017  9:18 PM       Note:  This document was prepared using Dragon voice recognition software and may include unintentional dictation errors.     Merrily Brittleifenbark, Yida Hyams, MD 07/11/17 [phone]

## 2017-07-09 NOTE — Discharge Instructions (Signed)
It was a pleasure to take care of you today, and thank you for coming to our emergency department.  If you have any questions or concerns before leaving please ask the nurse to grab me and I'm more than happy to go through your aftercare instructions again.  If you were prescribed any opioid pain medication today such as Norco, Vicodin, Percocet, morphine, hydrocodone, or oxycodone please make sure you do not drive when you are taking this medication as it can alter your ability to drive safely.  If you have any concerns once you are home that you are not improving or are in fact getting worse before you can make it to your follow-up appointment, please do not hesitate to call 911 and come back for further evaluation.  Merrily BrittleNeil Shalon Councilman, MD  Results for orders placed or performed during the hospital encounter of 07/09/17  Lipase, blood  Result Value Ref Range   Lipase 45 11 - 51 U/L  Comprehensive metabolic panel  Result Value Ref Range   Sodium 140 135 - 145 mmol/L   Potassium 3.9 3.5 - 5.1 mmol/L   Chloride 102 101 - 111 mmol/L   CO2 27 22 - 32 mmol/L   Glucose, Bld 97 65 - 99 mg/dL   BUN 27 (H) 6 - 20 mg/dL   Creatinine, Ser 1.611.28 (H) 0.44 - 1.00 mg/dL   Calcium 8.5 (L) 8.9 - 10.3 mg/dL   Total Protein 7.2 6.5 - 8.1 g/dL   Albumin 3.7 3.5 - 5.0 g/dL   AST 30 15 - 41 U/L   ALT 18 14 - 54 U/L   Alkaline Phosphatase 70 38 - 126 U/L   Total Bilirubin 0.3 0.3 - 1.2 mg/dL   GFR calc non Af Amer 36 (L) >60 mL/min   GFR calc Af Amer 42 (L) >60 mL/min   Anion gap 11 5 - 15  CBC  Result Value Ref Range   WBC 5.3 3.6 - 11.0 K/uL   RBC 3.98 3.80 - 5.20 MIL/uL   Hemoglobin 11.8 (L) 12.0 - 16.0 g/dL   HCT 09.634.8 (L) 04.535.0 - 40.947.0 %   MCV 87.4 80.0 - 100.0 fL   MCH 29.7 26.0 - 34.0 pg   MCHC 34.0 32.0 - 36.0 g/dL   RDW 81.113.6 91.411.5 - 78.214.5 %   Platelets 214 150 - 440 K/uL   [place] Venous Img Lower Unilateral Right  Result Date: 07/09/2017 CLINICAL DATA:  RIGHT leg pain and swelling EXAM: RIGHT LOWER  EXTREMITY VENOUS DOPPLER ULTRASOUND TECHNIQUE: Gray-scale sonography with graded compression, as well as color Doppler and duplex ultrasound were performed to evaluate the lower extremity deep venous systems from the level of the common femoral vein and including the common femoral, femoral, profunda femoral, popliteal and calf veins including the posterior tibial, peroneal and gastrocnemius veins when visible. The superficial great saphenous vein was also interrogated. Spectral Doppler was utilized to evaluate flow at rest and with distal augmentation maneuvers in the common femoral, femoral and popliteal veins. COMPARISON:  None. FINDINGS: Contralateral Common Femoral Vein: Respiratory phasicity is normal and symmetric with the symptomatic side. No evidence of thrombus. Normal compressibility. Common Femoral Vein: No evidence of thrombus. Normal compressibility, respiratory phasicity and response to augmentation. Saphenofemoral Junction: No evidence of thrombus. Normal compressibility and flow on color Doppler imaging. Profunda Femoral Vein: No evidence of thrombus. Normal compressibility and flow on color Doppler imaging. Femoral Vein: No evidence of thrombus. Normal compressibility, respiratory phasicity and response to augmentation. Popliteal Vein:  No evidence of thrombus. Normal compressibility, respiratory phasicity and response to augmentation. Calf Veins: Limited but no gross evidence of thrombus. Normal compressibility and flow on color Doppler imaging. Superficial Great Saphenous Vein: No evidence of thrombus. Normal compressibility. Venous Reflux:  None. Other Findings:  None. IMPRESSION: No evidence of deep venous thrombosis in the RIGHT lower extremity. Electronically Signed   By: Ulyses Southward M.D.   On: 07/09/2017 19:46

## 2017-07-09 NOTE — ED Notes (Signed)
Pt taken to room 6 via w/c by EDT Beth; [facility]; notif of pending troponin

## 2017-07-09 NOTE — ED Notes (Signed)
Lab called and states they lost the patient's urine sample and wanted to recollect.  Pt was already discharged from ER and no specimen was obtained.

## 2017-07-09 NOTE — ED Notes (Signed)
Family explains to doctor that they do no to be here for any additional work up. Blue legs are from the dye in her jeans and state her AAA is stable per her MD. Abdominal pain doesn't appear to be of concern. Sheri Porter states if any additional problems they will follow up with family dr

## 2017-07-09 NOTE — ED Triage Notes (Signed)
Pt arrived via POV with family with reports of abdominal discomfort and bloating after lunch today that started around 430pm today. Pt drank pepto bismol and currently is not complaining of pain.  Pt has AAA that her PCP is monitoring.  Family also concerned about patient's legs being blue, however, upon further observation, pt's jeans have rubbed onto her legs.  Pt's legs are warm and pulses are palpable. Pt does have right lower leg edema that is worse than the left.  Pt does c/o pain in right leg especially at night.

## 2018-01-31 ENCOUNTER — Other Ambulatory Visit: Payer: Self-pay | Admitting: Physician Assistant

## 2018-01-31 ENCOUNTER — Ambulatory Visit
Admission: RE | Admit: 2018-01-31 | Discharge: 2018-01-31 | Disposition: A | Discharge status: Home / Self Care | Payer: Medicare Other | Source: Ambulatory Visit | Attending: Physician Assistant | Admitting: Physician Assistant

## 2018-01-31 DIAGNOSIS — R600 Localized edema: Secondary | ICD-10-CM

## 2018-02-14 IMAGING — CR DG ABDOMEN ACUTE W/ 1V CHEST
3 series · 3 of 3 positions shown · non-contrast
Comparison: 04/18/2016

CLINICAL DATA: Rectal bleeding starting today

EXAM:
DG ABDOMEN ACUTE W/ 1V CHEST

[abdomen erect]
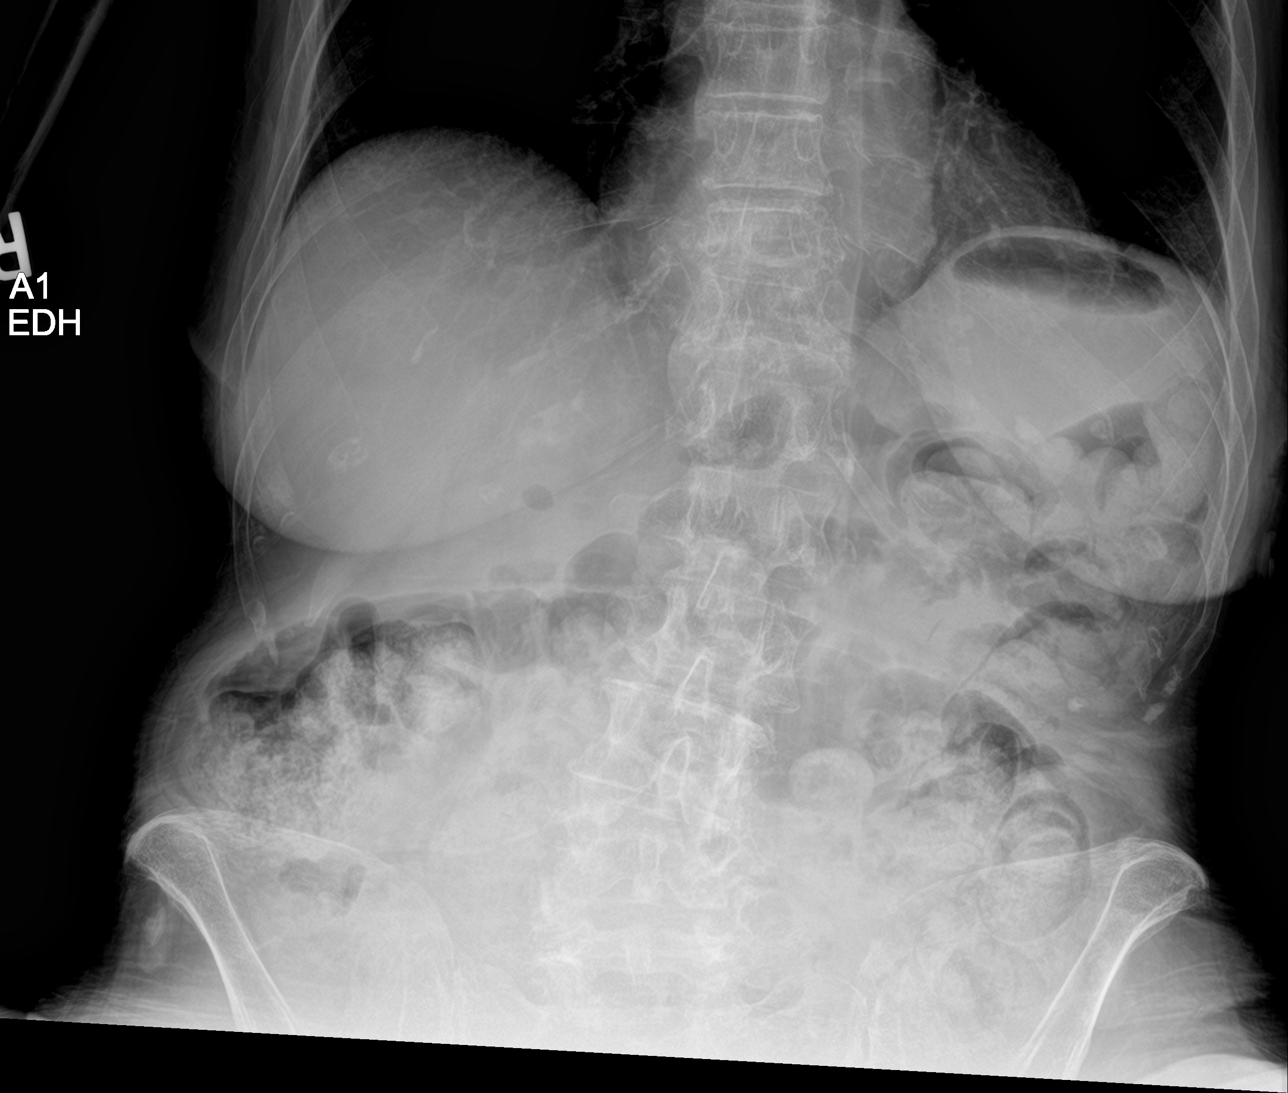

[abdomen supine]
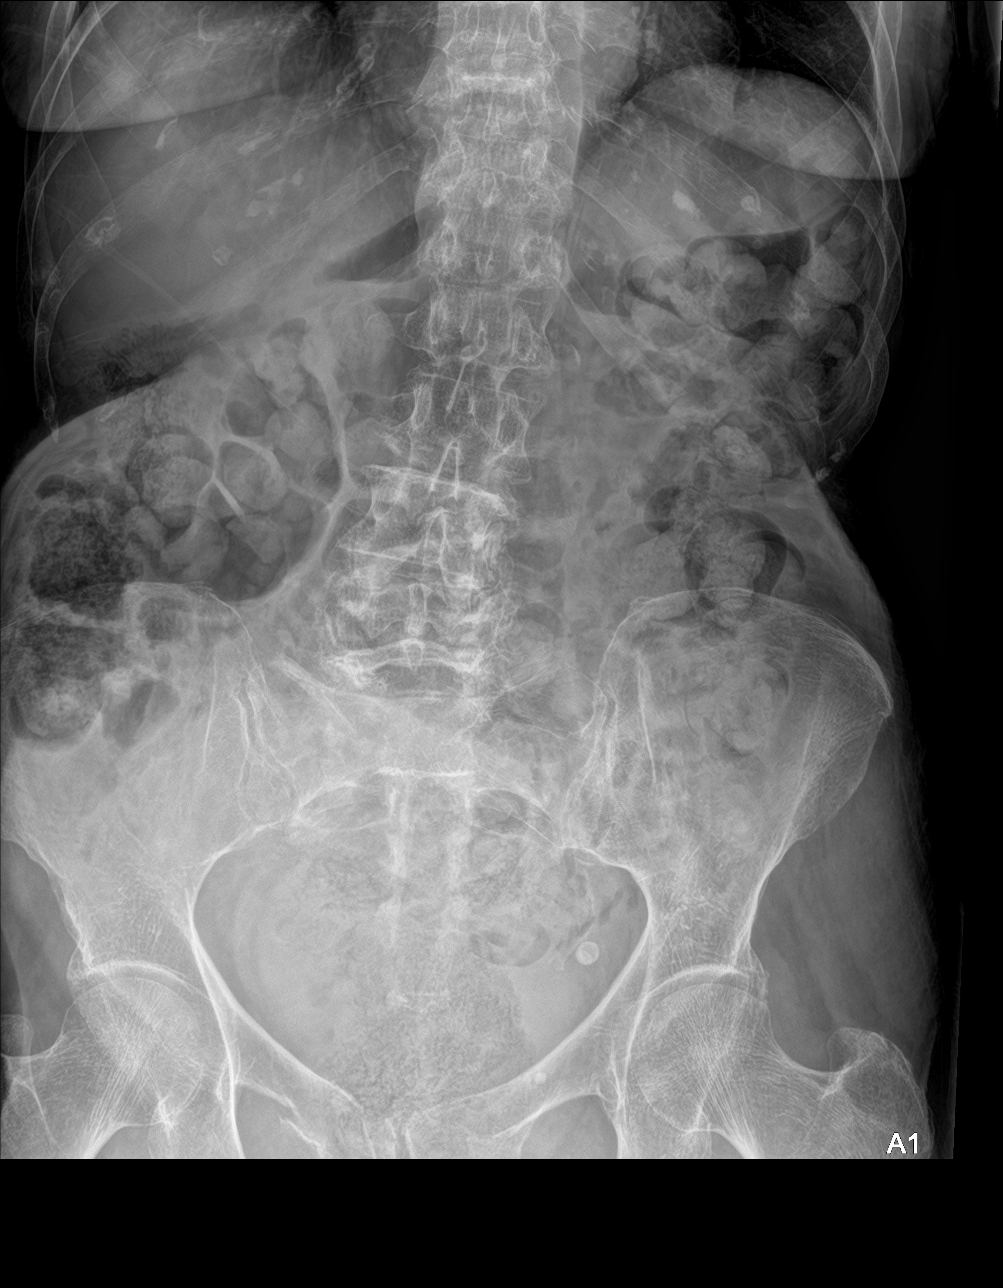

[chest ap]
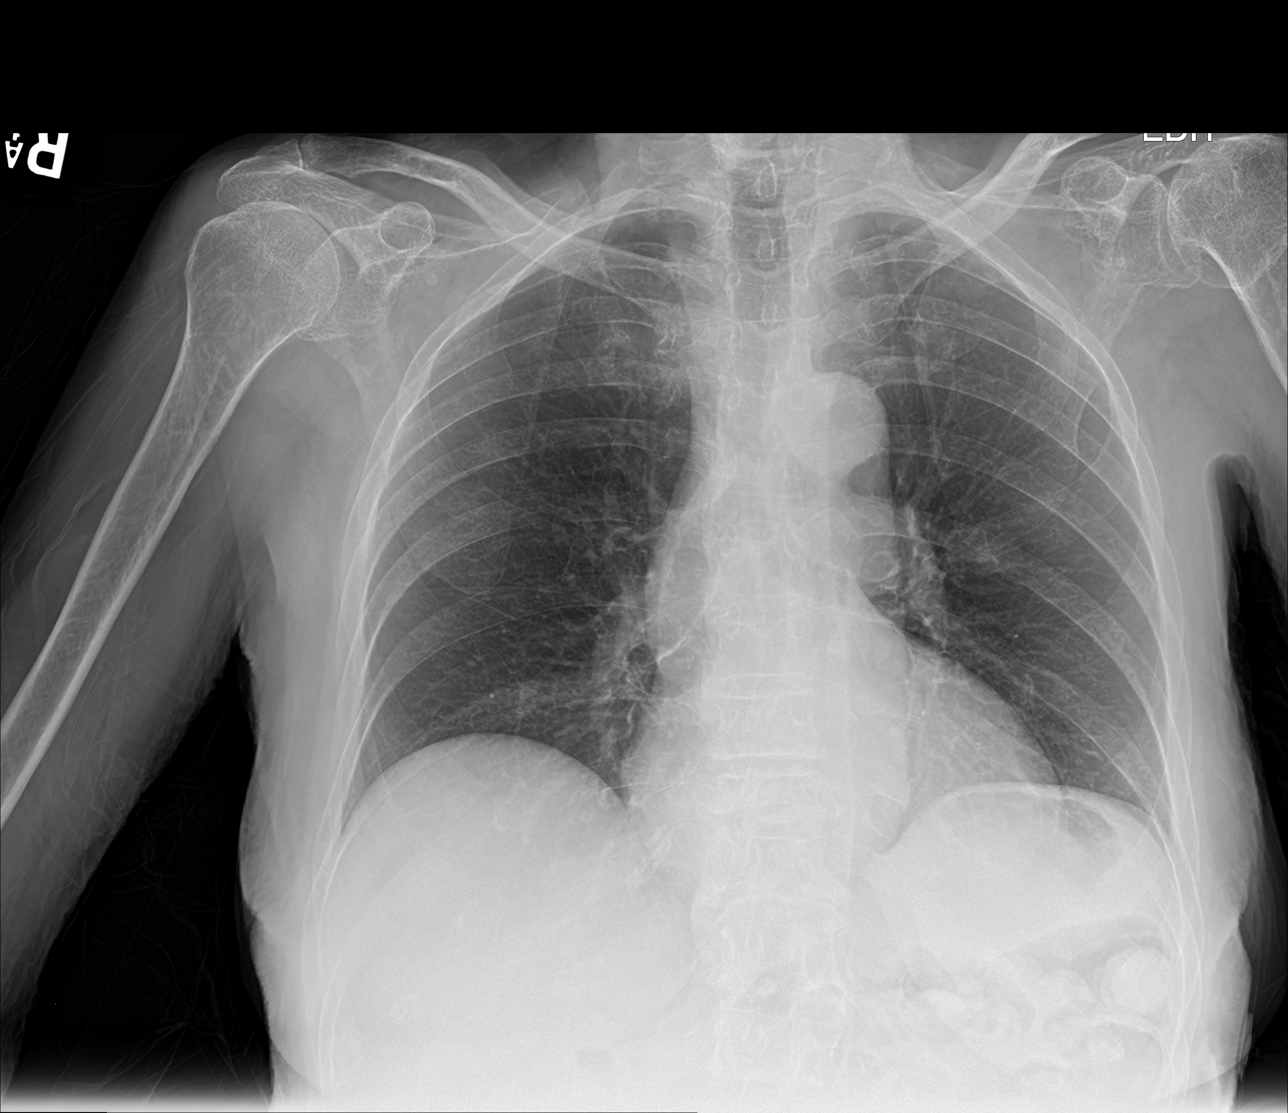

[3 of 3 positions shown; findings below may reference images not displayed]

FINDINGS: Normal small bowel gas pattern. Moderate gas and abundant stool
noted in right colon transverse colon proximal left colon. Moderate
stool noted within rectum. The rectum measures 6.5 cm in diameter
suspicious for mild fecal impaction. Again noted mild displaced
fracture of the right superior and inferior pubic ramus adjacent to
pubic symphysis.

Cardiomediastinal silhouette is unremarkable. No infiltrate or
pleural effusion. No pulmonary edema.
IMPRESSION: Moderate gas and abundant stool noted in right colon transverse
colon proximal left colon. Moderate stool noted within rectum. The
rectum measures 6.5 cm in diameter suspicious for mild fecal
impaction. Again noted mild displaced fracture of the right superior
and inferior pubic ramus adjacent to pubic symphysis. No acute
disease within chest.

## 2019-09-19 IMAGING — US US EXTREM LOW VENOUS*R*
1 series · 13 of 24 positions shown · non-contrast
Comparison: None.

CLINICAL DATA: 89-year-old with right lower extremity edema.

EXAM:
07/09/2017 LOWER EXTREMITY VENOUS DOPPLER ULTRASOUND
TECHNIQUE: Gray-scale sonography with graded compression, as well as color
Doppler and duplex ultrasound were performed to evaluate the lower
extremity deep venous systems from the level of the common femoral
vein and including the common femoral, femoral, profunda femoral,
popliteal and calf veins including the posterior tibial, peroneal
and gastrocnemius veins when visible. The superficial great
saphenous vein was also interrogated. Spectral Doppler was utilized
to evaluate flow at rest and with distal augmentation maneuvers in
the common femoral, femoral and popliteal veins.

[Series 1: us extrem low venous*right* · 0.07mm/px · 13 of 34 slices shown]
[im 1/34]
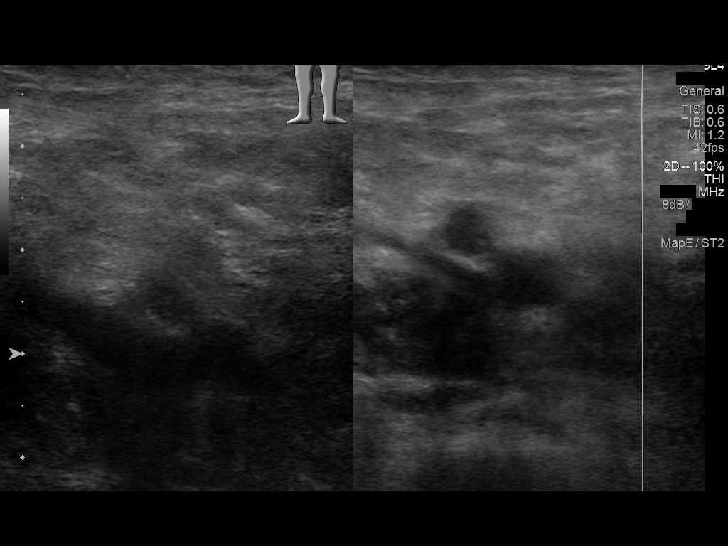
[im 3/34]
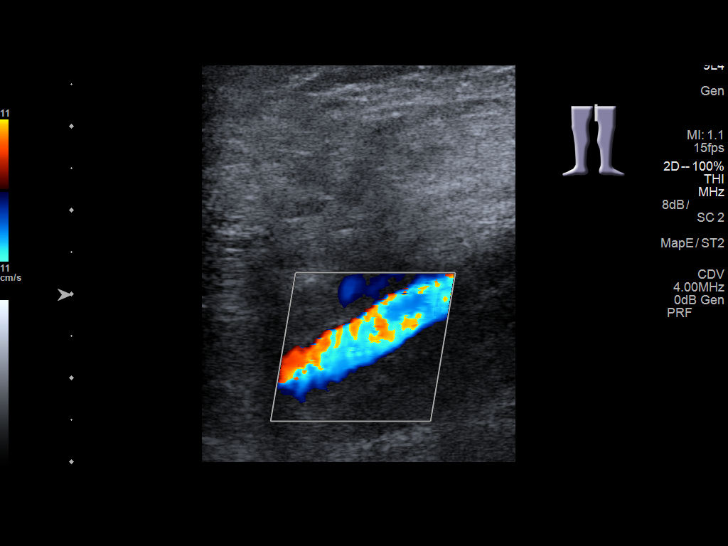
[im 6/34]
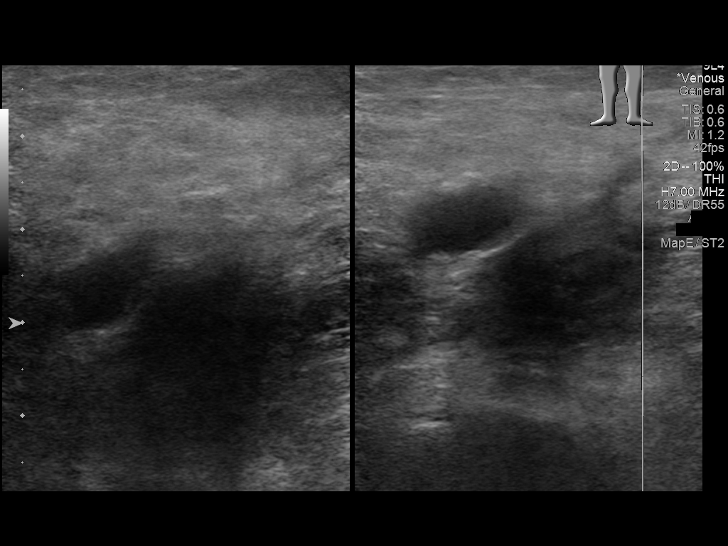
[im 9/34]
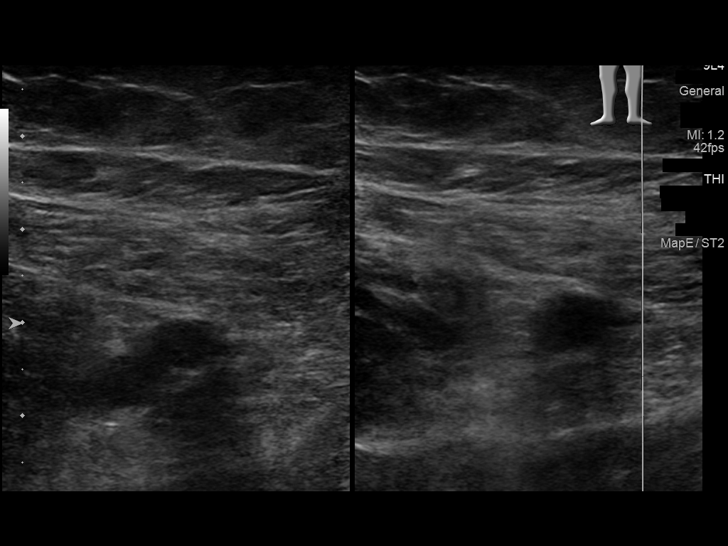
[im 12/34]
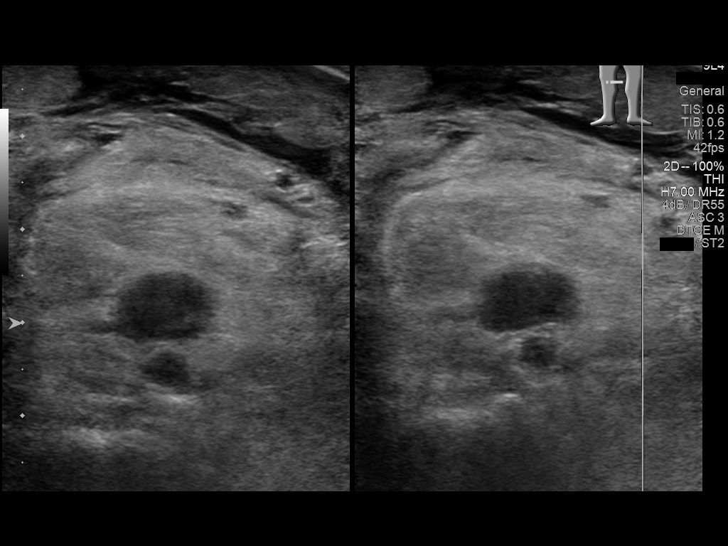
[im 15/34]
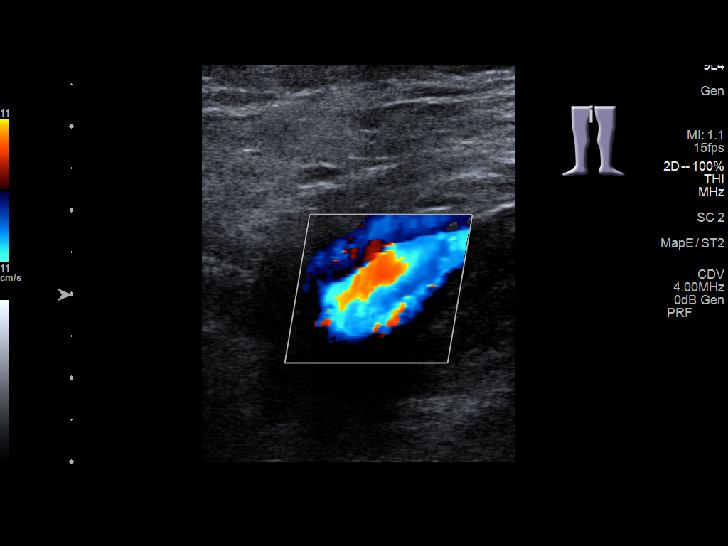
[im 18/34]
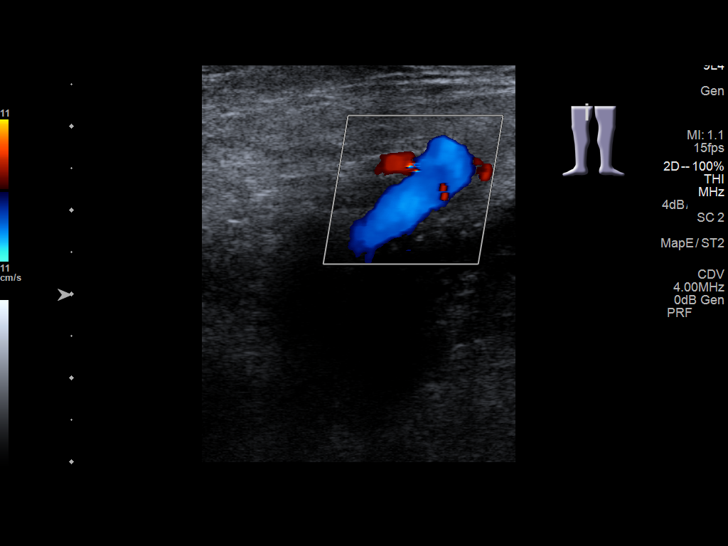
[im 19/34]
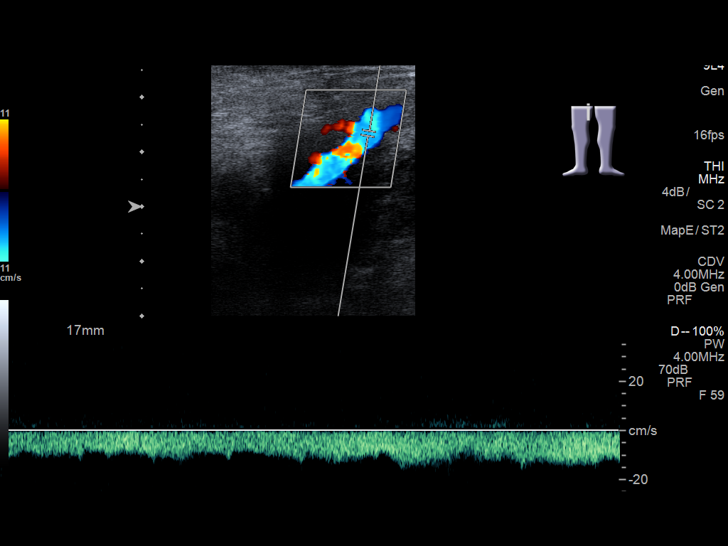
[im 22/34]
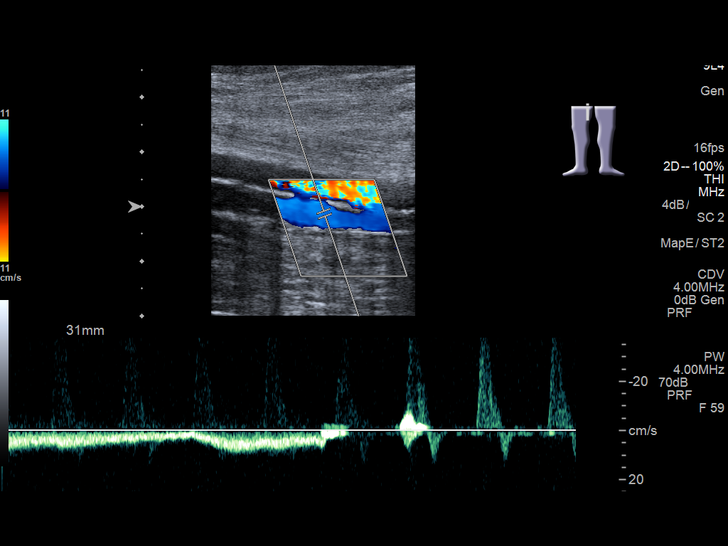
[im 25/34]
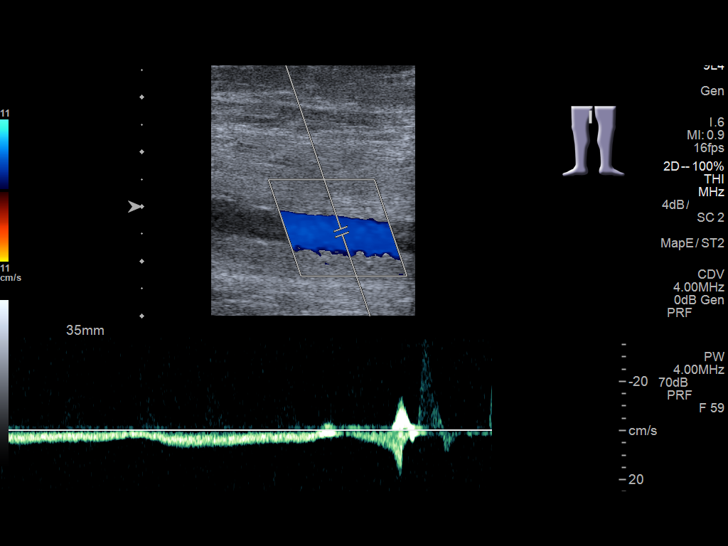
[im 28/34]
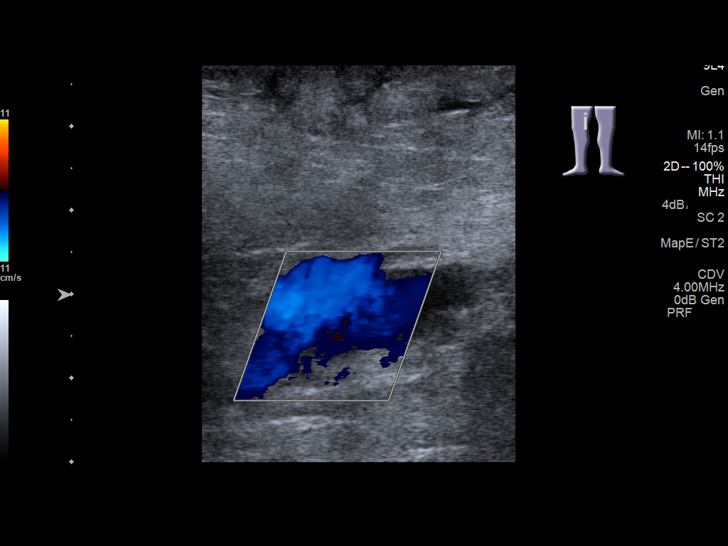
[im 31/34]
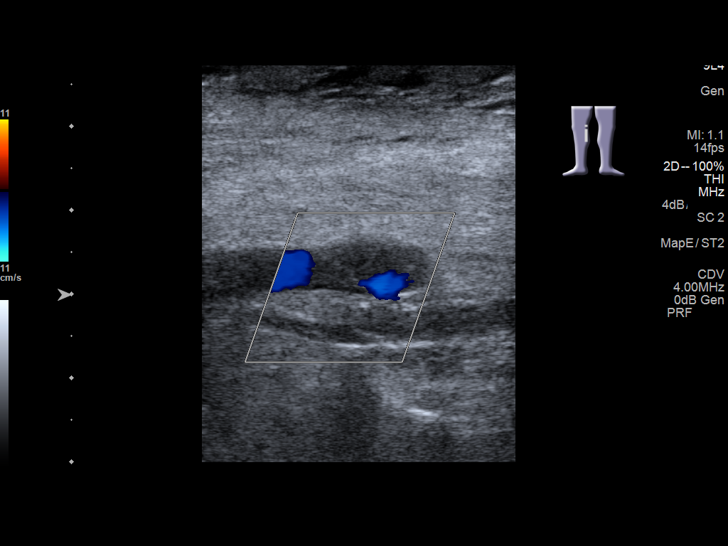
[im 34/34]
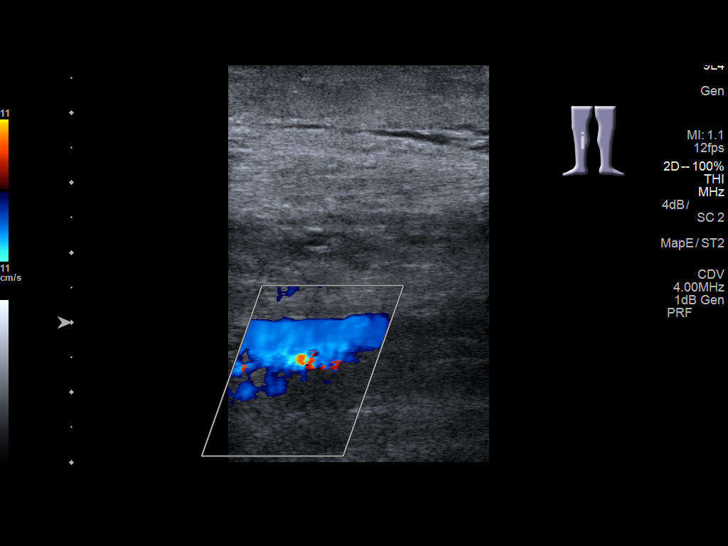

[13 of 24 positions shown; findings below may reference images not displayed]

FINDINGS: Contralateral Common Femoral Vein: Respiratory phasicity is normal
and symmetric with the symptomatic side. No evidence of thrombus.
Normal compressibility.

Common Femoral Vein: No evidence of thrombus. Normal
compressibility, respiratory phasicity and response to augmentation.

Saphenofemoral Junction: No evidence of thrombus. Normal
compressibility and flow on color Doppler imaging.

Profunda Femoral Vein: No evidence of thrombus. Normal
compressibility and flow on color Doppler imaging.

Femoral Vein: No evidence of thrombus. Normal compressibility,
respiratory phasicity and response to augmentation.

Popliteal Vein: The distal popliteal vein is noncompressible and
there is evidence of nonocclusive thrombus in this area. Proximal
popliteal vein is compressible and patent.

Calf Veins: One peroneal vein appears to be occluded. Deep calf
veins are not well demonstrated on this examination. Posterior
tibial veins appear to be patent.

Other Findings:  None.
IMPRESSION: Positive for deep venous thrombosis in the distal right popliteal
vein and involving one right peroneal vein. The distal popliteal
thrombus may be subacute or chronic based on the morphology. The
peroneal thrombus is age-indeterminate.

## 2019-11-30 DEATH — deceased
